# Patient Record
Sex: Male | Born: 1955 | Race: White | Hispanic: No | Marital: Married | Smoking: Former smoker
Health system: Southern US, Community
[De-identification: ages and names within clinical notes are randomized; demographics above are authoritative.]

## PROBLEM LIST (undated history)

## (undated) DIAGNOSIS — G2581 Restless legs syndrome: Secondary | ICD-10-CM

## (undated) DIAGNOSIS — K274 Chronic or unspecified peptic ulcer, site unspecified, with hemorrhage: Secondary | ICD-10-CM

## (undated) DIAGNOSIS — M1990 Unspecified osteoarthritis, unspecified site: Secondary | ICD-10-CM

## (undated) DIAGNOSIS — K219 Gastro-esophageal reflux disease without esophagitis: Secondary | ICD-10-CM

## (undated) DIAGNOSIS — M545 Low back pain, unspecified: Secondary | ICD-10-CM

## (undated) DIAGNOSIS — I10 Essential (primary) hypertension: Secondary | ICD-10-CM

## (undated) DIAGNOSIS — M542 Cervicalgia: Secondary | ICD-10-CM

## (undated) DIAGNOSIS — E119 Type 2 diabetes mellitus without complications: Secondary | ICD-10-CM

## (undated) DIAGNOSIS — R269 Unspecified abnormalities of gait and mobility: Secondary | ICD-10-CM

## (undated) DIAGNOSIS — N400 Enlarged prostate without lower urinary tract symptoms: Secondary | ICD-10-CM

## (undated) DIAGNOSIS — G8929 Other chronic pain: Secondary | ICD-10-CM

## (undated) DIAGNOSIS — J449 Chronic obstructive pulmonary disease, unspecified: Secondary | ICD-10-CM

## (undated) DIAGNOSIS — G629 Polyneuropathy, unspecified: Secondary | ICD-10-CM

## (undated) DIAGNOSIS — J45909 Unspecified asthma, uncomplicated: Secondary | ICD-10-CM

## (undated) HISTORY — DX: Unspecified asthma, uncomplicated: J45.909

## (undated) HISTORY — DX: Gastro-esophageal reflux disease without esophagitis: K21.9

## (undated) HISTORY — DX: Unspecified osteoarthritis, unspecified site: M19.90

## (undated) HISTORY — DX: Type 2 diabetes mellitus without complications: E11.9

## (undated) HISTORY — DX: Low back pain: M54.5

## (undated) HISTORY — DX: Cervicalgia: M54.2

## (undated) HISTORY — DX: Chronic obstructive pulmonary disease, unspecified: J44.9

## (undated) HISTORY — DX: Benign prostatic hyperplasia without lower urinary tract symptoms: N40.0

## (undated) HISTORY — DX: Polyneuropathy, unspecified: G62.9

## (undated) HISTORY — DX: Restless legs syndrome: G25.81

## (undated) HISTORY — DX: Chronic or unspecified peptic ulcer, site unspecified, with hemorrhage: K27.4

## (undated) HISTORY — DX: Other chronic pain: G89.29

## (undated) HISTORY — DX: Low back pain, unspecified: M54.50

## (undated) HISTORY — DX: Essential (primary) hypertension: I10

## (undated) HISTORY — DX: Unspecified abnormalities of gait and mobility: R26.9

---

## 2007-04-25 HISTORY — PX: APPENDECTOMY: SHX54

## 2012-07-04 DIAGNOSIS — M5136 Other intervertebral disc degeneration, lumbar region: Secondary | ICD-10-CM

## 2012-07-04 DIAGNOSIS — M179 Osteoarthritis of knee, unspecified: Secondary | ICD-10-CM | POA: Insufficient documentation

## 2012-07-04 DIAGNOSIS — M51369 Other intervertebral disc degeneration, lumbar region without mention of lumbar back pain or lower extremity pain: Secondary | ICD-10-CM

## 2012-07-04 HISTORY — DX: Osteoarthritis of knee, unspecified: M17.9

## 2012-07-04 HISTORY — DX: Other intervertebral disc degeneration, lumbar region without mention of lumbar back pain or lower extremity pain: M51.369

## 2014-11-26 DIAGNOSIS — H9190 Unspecified hearing loss, unspecified ear: Secondary | ICD-10-CM

## 2014-11-26 DIAGNOSIS — R972 Elevated prostate specific antigen [PSA]: Secondary | ICD-10-CM

## 2014-11-26 DIAGNOSIS — G4730 Sleep apnea, unspecified: Secondary | ICD-10-CM

## 2014-11-26 DIAGNOSIS — C61 Malignant neoplasm of prostate: Secondary | ICD-10-CM

## 2014-11-26 DIAGNOSIS — J439 Emphysema, unspecified: Secondary | ICD-10-CM

## 2014-11-26 DIAGNOSIS — I10 Essential (primary) hypertension: Secondary | ICD-10-CM | POA: Insufficient documentation

## 2014-11-26 HISTORY — DX: Elevated prostate specific antigen (PSA): R97.20

## 2014-11-26 HISTORY — DX: Malignant neoplasm of prostate: C61

## 2014-11-26 HISTORY — DX: Essential (primary) hypertension: I10

## 2014-11-26 HISTORY — DX: Sleep apnea, unspecified: G47.30

## 2014-11-26 HISTORY — DX: Unspecified hearing loss, unspecified ear: H91.90

## 2014-11-26 HISTORY — DX: Emphysema, unspecified: J43.9

## 2014-12-30 DIAGNOSIS — K219 Gastro-esophageal reflux disease without esophagitis: Secondary | ICD-10-CM | POA: Insufficient documentation

## 2014-12-30 DIAGNOSIS — J45909 Unspecified asthma, uncomplicated: Secondary | ICD-10-CM | POA: Insufficient documentation

## 2014-12-30 DIAGNOSIS — J309 Allergic rhinitis, unspecified: Secondary | ICD-10-CM

## 2014-12-30 HISTORY — DX: Gastro-esophageal reflux disease without esophagitis: K21.9

## 2014-12-30 HISTORY — DX: Allergic rhinitis, unspecified: J30.9

## 2015-02-08 ENCOUNTER — Ambulatory Visit: Payer: Medicare Other | Admitting: Allergy and Immunology

## 2015-02-11 ENCOUNTER — Ambulatory Visit: Payer: Medicare Other | Admitting: Allergy and Immunology

## 2015-03-08 ENCOUNTER — Encounter: Payer: Self-pay | Admitting: Allergy and Immunology

## 2015-03-08 ENCOUNTER — Other Ambulatory Visit: Payer: Self-pay

## 2015-03-08 ENCOUNTER — Ambulatory Visit (INDEPENDENT_AMBULATORY_CARE_PROVIDER_SITE_OTHER): Payer: Medicare Other | Admitting: Allergy and Immunology

## 2015-03-08 VITALS — BP 130/76 | HR 92 | Resp 18 | Ht 70.0 in | Wt 325.0 lb

## 2015-03-08 DIAGNOSIS — K219 Gastro-esophageal reflux disease without esophagitis: Secondary | ICD-10-CM

## 2015-03-08 DIAGNOSIS — J4540 Moderate persistent asthma, uncomplicated: Secondary | ICD-10-CM | POA: Diagnosis not present

## 2015-03-08 DIAGNOSIS — J3089 Other allergic rhinitis: Secondary | ICD-10-CM | POA: Diagnosis not present

## 2015-03-08 DIAGNOSIS — F329 Major depressive disorder, single episode, unspecified: Secondary | ICD-10-CM

## 2015-03-08 DIAGNOSIS — J387 Other diseases of larynx: Secondary | ICD-10-CM

## 2015-03-08 DIAGNOSIS — F32A Depression, unspecified: Secondary | ICD-10-CM

## 2015-03-08 MED ORDER — DEXLANSOPRAZOLE 60 MG PO CPDR: 60.0000 mg | DELAYED_RELEASE_CAPSULE | Freq: Every day | ORAL | Status: DC

## 2015-03-08 MED ORDER — DEXLANSOPRAZOLE 60 MG PO CPDR
60.0000 mg | DELAYED_RELEASE_CAPSULE | Freq: Two times a day (BID) | ORAL | Status: DC
Start: 2015-03-08 — End: 2022-04-06

## 2015-03-08 NOTE — Progress Notes (Signed)
[facility]   Follow-up Note  Refering Provider: No ref. provider found Primary Provider: Noni SaupeEDDING II,JOHN F., MD  Subjective:   Marcus PeatDarrell Theisen Jr. is a 59 y.o. male who [facility] in re-evaluation of the following:  HPI Comments:  Marcus Harding returns to this clinic noting that he has done relatively well regarding his lungs and nose while consistently using medical therapy which at this point in time includes a combination of Symbicort and nasal fluticasone. He has not had any exacerbations of his asthma nor need to use a systemic steroids since I last seen him in this clinic approximately 3 months ago. He does not use a short acting bronchodilator. His reflux is been under very good control while using Dexilant twice a day. When he uses it once a day he develops problems with reflux. He has had an active issue with his depression. He recently saw Dr. Jeanie Seweredding who put him on Wellbutrin in addition to his Zoloft which has not resulted in resolution of this issue. He's been on this medication for 4 weeks.   Outpatient Encounter Prescriptions as of 03/08/2015  Medication Sig  . albuterol (PROVENTIL HFA) 108 (90 BASE) MCG/ACT inhaler Inhale into the lungs.  . budesonide-formoterol (SYMBICORT) 160-4.5 MCG/ACT inhaler Inhale 2 puffs into the lungs 2 (two) times daily.  . BuPROPion HCl (WELLBUTRIN PO) Take 1 tablet by mouth daily.  . celecoxib (CELEBREX) 100 MG capsule Take 200 mg by mouth.  . fluticasone (VERAMYST) 27.5 MCG/SPRAY nasal spray Place 1 spray into the nose daily.  Marland Kitchen. gabapentin (NEURONTIN) 300 MG capsule Take 300 mg by mouth.  Marland Kitchen. lisinopril (PRINIVIL,ZESTRIL) 40 MG tablet Take 40 mg by mouth.  Marland Kitchen. rOPINIRole (REQUIP) 0.5 MG tablet Take 0.5 mg by mouth at bedtime.  Marland Kitchen. rOPINIRole (REQUIP) 1 MG tablet Take 1 mg by mouth.  . sertraline (ZOLOFT) 100 MG tablet Take 100 mg by mouth.  . sodium chloride (OCEAN)  0.65 % nasal spray Place 1 spray into the nose.  . tamsulosin (FLOMAX) 0.4 MG CAPS capsule Take 0.4 mg by mouth at bedtime.  . [DISCONTINUED] albuterol (PROVENTIL HFA) 108 (90 BASE) MCG/ACT inhaler Inhale 2 puffs into the lungs every 6 (six) hours as needed for wheezing or shortness of breath.  . [DISCONTINUED] dexlansoprazole (DEXILANT) 60 MG capsule Take 1 capsule (60 mg total) by mouth daily.  . [DISCONTINUED] lisinopril (PRINIVIL,ZESTRIL) 40 MG tablet Take 40 mg by mouth daily.  . [DISCONTINUED] sertraline (ZOLOFT) 100 MG tablet Take 100 mg by mouth daily.   No facility-administered encounter medications on file as of 03/08/2015.    Meds ordered this encounter  Medications  . DISCONTD: dexlansoprazole (DEXILANT) 60 MG capsule    Sig: Take 1 capsule (60 mg total) by mouth daily.    Dispense:  60 capsule    Refill:  5    Past Medical History  Diagnosis Date  . Asthma     Past Surgical History  Procedure Laterality Date  . Appendectomy  2009    Allergies  Allergen Reactions  . Corticosteroids Rash  . Cortisol [Hydrocortisone]   . Cortisone     Other reaction(s): ITCHING  . Other     Other reaction(s): ITCHING    Review of Systems  Constitutional: Negative for fever and chills.  HENT: Negative for congestion, ear pain, facial swelling, nosebleeds, postnasal drip, rhinorrhea, sinus pressure, sneezing, sore throat, tinnitus, trouble swallowing and voice change.  Eyes: Negative for pain, discharge, redness and itching.  Respiratory: Negative for cough, choking, chest tightness, shortness of breath, wheezing and stridor.   Cardiovascular: Negative for chest pain and leg swelling.  Gastrointestinal: Negative for nausea, vomiting and abdominal pain.  Endocrine: Negative for cold intolerance and heat intolerance.  Genitourinary: Negative for difficulty urinating.  Allergic/Immunologic: Negative.   Neurological: Negative for dizziness.  Hematological: Negative for  adenopathy.  Psychiatric/Behavioral: Positive for dysphoric mood.     Objective:   Filed Vitals:   03/08/15 1117  BP: 130/76  Pulse: 92  Resp: 18   Height:  (177.8 cm)  Weight: (!) 325 lb (147.419 kg)   Physical Exam  Constitutional: He appears well-developed and well-nourished. No distress.  HENT:  Head: Normocephalic and atraumatic. Head is without right periorbital erythema and without left periorbital erythema.  Right Ear: Tympanic membrane, external ear and ear canal normal. No drainage or tenderness. No foreign bodies. Tympanic membrane is not injected, not scarred, not perforated, not erythematous, not retracted and not bulging. No middle ear effusion.  Left Ear: Tympanic membrane, external ear and ear canal normal. No drainage or tenderness. No foreign bodies. Tympanic membrane is not injected, not scarred, not perforated, not erythematous, not retracted and not bulging.  No middle ear effusion.  Nose: Nose normal. No mucosal edema, rhinorrhea, nose lacerations or sinus tenderness.  No foreign bodies.  Mouth/Throat: Oropharynx is clear and moist. No oropharyngeal exudate, posterior oropharyngeal edema, posterior oropharyngeal erythema or tonsillar abscesses.  Eyes: Lids are normal. Right eye exhibits no chemosis, no discharge and no exudate. No foreign body present in the right eye. Left eye exhibits no chemosis, no discharge and no exudate. No foreign body present in the left eye. Right conjunctiva is not injected. Left conjunctiva is not injected.  Neck: Neck supple. No tracheal tenderness present. No tracheal deviation and no edema present. No thyroid mass and no thyromegaly present.  Cardiovascular: Normal rate, regular rhythm, S1 normal and S2 normal.  Exam reveals no gallop.   No murmur heard. Pulmonary/Chest: No accessory muscle usage or stridor. No respiratory distress. He has no wheezes. He has no rhonchi. He has no rales.  Abdominal: Soft.  Lymphadenopathy:        Head (right side): No tonsillar adenopathy present.       Head (left side): No tonsillar adenopathy present.    He has no cervical adenopathy.  Neurological: He is alert.  Skin: No rash noted. He is not diaphoretic. No erythema.  Psychiatric: He has a normal mood and affect. His behavior is normal.    Diagnostics:    Spirometry was performed and demonstrated an FEV1 of 3.05 at 84 % of predicted.  The patient had an Asthma Control Test with the following results: ACT Total Score: 16.    Assessment and Plan:   1. Asthma, moderate persistent, uncomplicated   2. LPRD (laryngopharyngeal reflux disease)   3. Other allergic rhinitis   4. Depression      1. Dexilant 60 one tablet TWO times a day. If insurance will not pay for two times per day then can try  of ranitidine in evening while continuing the dexilant in the morning.  2. Continue symbicort 160 two inhalations two times per day   3. Continue nasal fluticasone 1-2 sprays each nostril 3-7 times per week.  4. Continue Provental HFA if needed.  5. Revisit with Dr. Jeanie Sewer about depression treatment.  6. Return in 6 months or earlier if problem  Overall Marcus is done relatively well and I do not really want to change his medications at this point in time. I will see him back in this clinic in 6 months or earlier if there is a problem. We may need to manipulate his medications based upon his insurance coverage. I've asked him to discuss with Dr. Jeanie Sewer about further treatment concerning his depression.   Laurette Schimke, MD  [facility]

## 2015-03-08 NOTE — Patient Instructions (Signed)
  1. Dexilant 60 one tablet TWO times a day. If insurance will not pay for two times per day then can try 300mg  of ranitidine in evening while continuing the dexilant in the morning.  2. Continue symbicort 160 two inhalations two times per day   3. Continue nasal fluticasone 1-2 sprays each nostril 3-7 times per week.  4. Continue Provental HFA if needed.  5. Revisit with Dr. Jeanie Seweredding about depression treatment.  6. Return in 6 months or earlier if problem

## 2015-09-06 ENCOUNTER — Ambulatory Visit: Payer: Medicare Other | Admitting: Allergy and Immunology

## 2015-09-22 ENCOUNTER — Ambulatory Visit (INDEPENDENT_AMBULATORY_CARE_PROVIDER_SITE_OTHER): Payer: Medicare Other | Admitting: Allergy and Immunology

## 2015-09-22 ENCOUNTER — Encounter: Payer: Self-pay | Admitting: Allergy and Immunology

## 2015-09-22 VITALS — BP 150/80 | HR 76 | Resp 24

## 2015-09-22 DIAGNOSIS — J4540 Moderate persistent asthma, uncomplicated: Secondary | ICD-10-CM | POA: Diagnosis not present

## 2015-09-22 DIAGNOSIS — G4733 Obstructive sleep apnea (adult) (pediatric): Secondary | ICD-10-CM | POA: Diagnosis not present

## 2015-09-22 DIAGNOSIS — Z9989 Dependence on other enabling machines and devices: Secondary | ICD-10-CM

## 2015-09-22 DIAGNOSIS — J387 Other diseases of larynx: Secondary | ICD-10-CM

## 2015-09-22 DIAGNOSIS — J3089 Other allergic rhinitis: Secondary | ICD-10-CM | POA: Diagnosis not present

## 2015-09-22 DIAGNOSIS — K219 Gastro-esophageal reflux disease without esophagitis: Secondary | ICD-10-CM

## 2015-09-22 NOTE — Patient Instructions (Addendum)
  1. Continue Dexilant 60 one tablet one times a day.    2. Continue symbicort 160 two inhalations two times per day   3. Continue nasal fluticasone 1-2 sprays each nostril 3-7 times per week.  4. Continue Provental HFA if needed.  5. Return in 6 months or earlier if problem  6. Get fall flu vaccine

## 2015-09-22 NOTE — Progress Notes (Signed)
Follow-up Note  Referring Provider: Noni Saupe, MD Primary Provider: Noni Saupe., MD Date of Office Visit: 09/22/2015  Subjective:   Marcus Harding. (DOB: 1955/05/05) is a 60 y.o. male who [facility] in re-evaluation of the following:  HPI: Marcus Harding presents to this clinic in reevaluation of his asthma and allergic rhinoconjunctivitis and reflux-induced respiratory disease. I've not seen him in his clinic since November 2016.  During the interval his asthma has been under very good control without the need for systemic steroid and his bronchodilator requirement is approximately 1 time per week. He does not really exercise to any degree basically because of a musculoskeletal issue. He's been consistently using his inhaled steroid-laba combination.  His nose has not really been causing him any difficulty throughout the past 6 months. He does not use any nasal fluticasone at this point in time because his nose is doing so well.  Reflux has been under control. He did visit with Dr. Jennye Boroughs who performed a colonoscopy and an endoscopy. He continues on Dexilant one time per day and is not using any other medications for his reflux at this point.  He remains on CPAP for his sleep apnea yet he does appear to have some disturbed sleep. He usally wakes up once or twice a night for some unknown reason and sometimes he'll get up at around 3:00 in the morning and just start the day. His wife tells him that he probably needs to have a repeat sleep study but he is not interested in all in having any further evaluation for his sleep apnea and is satisfied with using his CPAP machine as presently operating.    Medication List           budesonide-formoterol 160-4.5 MCG/ACT inhaler  Commonly known as:  SYMBICORT  Inhale 2 puffs into the lungs 2 (two) times daily.     celecoxib 100 MG capsule  Commonly known as:  CELEBREX  Take 200  mg by mouth.     dexlansoprazole 60 MG capsule  Commonly known as:  DEXILANT  Take 1 capsule (60 mg total) by mouth 2 (two) times daily.     gabapentin 300 MG capsule  Commonly known as:  NEURONTIN  Take 300 mg by mouth.     lisinopril 40 MG tablet  Commonly known as:  PRINIVIL,ZESTRIL  Take 40 mg by mouth.     PROVENTIL HFA 108 (90 Base) MCG/ACT inhaler  Generic drug:  albuterol  Inhale into the lungs.     rOPINIRole 0.5 MG tablet  Commonly known as:  REQUIP  Take 0.5 mg by mouth at bedtime.     rOPINIRole 1 MG tablet  Commonly known as:  REQUIP  Take 1 mg by mouth.     sertraline 100 MG tablet  Commonly known as:  ZOLOFT  Take 100 mg by mouth.     sodium chloride 0.65 % nasal spray  Commonly known as:  OCEAN  Place 1 spray into the nose.     tamsulosin 0.4 MG Caps capsule  Commonly known as:  FLOMAX  Take 0.4 mg by mouth at bedtime.     WELLBUTRIN PO  Take 1 tablet by mouth daily.        Past Medical History  Diagnosis Date  . Asthma   . GERD (gastroesophageal reflux disease)     Past Surgical History  Procedure Laterality Date  . Appendectomy  2009  Allergies  Allergen Reactions  . Corticosteroids Rash  . Cortisol [Hydrocortisone]   . Cortisone     Other reaction(s): ITCHING  . Other     Other reaction(s): ITCHING    Review of systems negative except as noted in HPI / PMHx or noted below:  Review of Systems  Constitutional: Negative.   HENT: Negative.   Eyes: Negative.   Respiratory: Negative.   Cardiovascular: Negative.   Gastrointestinal: Negative.   Genitourinary: Negative.   Musculoskeletal: Negative.   Skin: Negative.   Neurological: Negative.   Endo/Heme/Allergies: Negative.   Psychiatric/Behavioral: Negative.      Objective:   Filed Vitals:   09/22/15 1646  BP: 150/80  Pulse: 76  Resp: 24          Physical Exam  Constitutional: He is well-developed, well-nourished, and in no distress.  HENT:  Head:  Normocephalic.  Right Ear: Tympanic membrane, external ear and ear canal normal.  Left Ear: Tympanic membrane, external ear and ear canal normal.  Nose: Nose normal. No mucosal edema or rhinorrhea.  Mouth/Throat: Uvula is midline, oropharynx is clear and moist and mucous membranes are normal. No oropharyngeal exudate.  Eyes: Conjunctivae are normal.  Neck: Trachea normal. No tracheal tenderness present. No tracheal deviation present. No thyromegaly present.  Cardiovascular: Normal rate, regular rhythm, S1 normal, S2 normal and normal heart sounds.   No murmur heard. Pulmonary/Chest: Breath sounds normal. No stridor. No respiratory distress. He has no wheezes. He has no rales.  Musculoskeletal: He exhibits no edema.  Lymphadenopathy:       Head (right side): No tonsillar adenopathy present.       Head (left side): No tonsillar adenopathy present.    He has no cervical adenopathy.  Neurological: He is alert. Gait normal.  Skin: No rash noted. He is not diaphoretic. No erythema. Nails show no clubbing.  Psychiatric: Mood and affect normal.    Diagnostics:    Spirometry was performed and demonstrated an FEV1 of 2.42 at 67 % of predicted.  The patient had an Asthma Control Test with the following results: ACT Total Score: 15.    Assessment and Plan:   1. Asthma, moderate persistent, uncomplicated   2. LPRD (laryngopharyngeal reflux disease)   3. Other allergic rhinitis   4. OSA on CPAP     1. Continue Dexilant 60 one tablet one times a day.    2. Continue symbicort 160 two inhalations two times per day   3. Continue nasal fluticasone 1-2 sprays each nostril 3-7 times per week.  4. Continue Provental HFA if needed.  5. Return in 6 months or earlier if problem  6. Get fall flu vaccine  Marcus Harding appears to be doing relatively well at this point and I see no need for changing his medical therapy and he will continue to use anti-inflammatory medications for his respiratory tract and  continue to use a proton pump inhibitor for his reflux. I will see him back in this clinic in 6 months or earlier if there is a problem.  Marcus SchimkeEric Kozlow, MD [facility]

## 2016-05-09 DIAGNOSIS — H18413 Arcus senilis, bilateral: Secondary | ICD-10-CM | POA: Diagnosis not present

## 2016-05-09 DIAGNOSIS — H25813 Combined forms of age-related cataract, bilateral: Secondary | ICD-10-CM | POA: Diagnosis not present

## 2016-05-09 DIAGNOSIS — H04123 Dry eye syndrome of bilateral lacrimal glands: Secondary | ICD-10-CM | POA: Diagnosis not present

## 2016-05-23 ENCOUNTER — Ambulatory Visit (INDEPENDENT_AMBULATORY_CARE_PROVIDER_SITE_OTHER): Payer: Medicare PPO | Admitting: Allergy

## 2016-05-23 ENCOUNTER — Encounter: Payer: Self-pay | Admitting: Allergy

## 2016-05-23 VITALS — BP 120/74 | HR 68 | Temp 98.3°F | Resp 16

## 2016-05-23 DIAGNOSIS — J0100 Acute maxillary sinusitis, unspecified: Secondary | ICD-10-CM

## 2016-05-23 DIAGNOSIS — J111 Influenza due to unidentified influenza virus with other respiratory manifestations: Secondary | ICD-10-CM

## 2016-05-23 DIAGNOSIS — J4541 Moderate persistent asthma with (acute) exacerbation: Secondary | ICD-10-CM | POA: Diagnosis not present

## 2016-05-23 MED ORDER — AZITHROMYCIN 250 MG PO TABS: ORAL_TABLET | ORAL | 0 refills | Status: DC

## 2016-05-23 MED ORDER — OSELTAMIVIR PHOSPHATE 75 MG PO CAPS: ORAL_CAPSULE | ORAL | 0 refills | Status: DC

## 2016-05-23 NOTE — Patient Instructions (Addendum)
Respiratory tract infection with asthma exacerbation   - your symptoms appear consistent with influenza   - will prescribe Tamiflu for treatment of influenza as well as Azithromycin   - take prednisone as prescribed (20mg  twice day x 3 days, then 20mg  x 1 day, the 10mg  x1 day then stop   - continue your routine asthma medications (symbicort and as needed albuterol).    - continue nasal saline rinses followed by your nasal spray    - tylenol and/or ibuprofen (alternating every 3 hours if you use both) for fever control and body aches    - maintain adequate hydration  Follow-up in 3 months or sooner if needed

## 2016-05-23 NOTE — Progress Notes (Signed)
Follow-up Note  RE: Marcus Harding. MRN: [id number] DOB: 14-Nov-1955 Date of Office Visit: 05/23/2016   History of present illness: Marcus Harding. is a 61 y.o. male presenting today for sick visit. He presents today with his wife who has similar symptoms. He was last seen in the office for routine follow-up in May 2017 by Dr. Lucie Leather.   He reports coughing, wheezing, chest tightness. He has had green nasal drainage and phlegm.   He does not feel he has had any fever.  Headaches for past week.  He reports his upper teeth hurt and into his cheeks.  He reports joint pains/aches at baseline however they are a bit worse currently.  He was not sure who was sick first time or his wife.  He otherwise denies any other sick contacts however they do visit "rest homes" but they were not told that there were any illnesses.  He continues to use his Symbicort 2 puffs twice a day.  He has albuterol that he reports uses infrequently and has not used with current symptoms. He also has Flonase that he has not used. His wife does report they both use nasal saline rinses.     Review of systems: Review of Systems  Constitutional: Positive for malaise/fatigue. Negative for chills, diaphoresis and fever.  HENT: Positive for congestion and sinus pain. Negative for ear pain, nosebleeds and sore throat.   Eyes: Negative for discharge and redness.  Respiratory: Positive for cough, shortness of breath and wheezing.   Cardiovascular: Positive for chest pain (Tightness).  Gastrointestinal: Positive for heartburn. Negative for abdominal pain, diarrhea, nausea and vomiting.  Musculoskeletal: Positive for joint pain.  Skin: Negative for itching and rash.    All other systems negative unless noted above in HPI  Past medical/social/surgical/family history have been reviewed and are unchanged unless specifically indicated below.  No changes  Medication List: Allergies as of 05/23/2016      Reactions   Corticosteroids Rash   Cortisol [hydrocortisone]    Cortisone    Other reaction(s): ITCHING   Other    Other reaction(s): ITCHING      Medication List       Accurate as of 05/23/16  5:32 PM. Always use your most recent med list.          azithromycin 250 MG tablet Commonly known as:  ZITHROMAX Take two tablets day one then one tablet daily for 4 days   B-12 1000 MCG Caps Take 1 capsule by mouth 2 (two) times daily.   budesonide-formoterol 160-4.5 MCG/ACT inhaler Commonly known as:  SYMBICORT Inhale 2 puffs into the lungs 2 (two) times daily.   celecoxib 200 MG capsule Commonly known as:  CELEBREX Take 200 mg by mouth daily.   dexlansoprazole 60 MG capsule Commonly known as:  DEXILANT Take 1 capsule (60 mg total) by mouth 2 (two) times daily.   gabapentin 400 MG capsule Commonly known as:  NEURONTIN Take 1 capsule by mouth 3 (three) times daily.   lisinopril 40 MG tablet Commonly known as:  PRINIVIL,ZESTRIL Take 20 mg by mouth.   oseltamivir 75 MG capsule Commonly known as:  TAMIFLU Take one capsule twice daily for 5 days   pravastatin 20 MG tablet Commonly known as:  PRAVACHOL daily.   PROVENTIL HFA 108 (90 Base) MCG/ACT inhaler Generic drug:  albuterol Inhale into the lungs.   rOPINIRole 0.5 MG tablet Commonly known as:  REQUIP Take 0.5 mg by mouth at bedtime.   rOPINIRole  1 MG tablet Commonly known as:  REQUIP Take 1 mg by mouth.   sertraline 100 MG tablet Commonly known as:  ZOLOFT Take 100 mg by mouth 2 (two) times daily.   tamsulosin 0.4 MG Caps capsule Commonly known as:  FLOMAX Take 0.4 mg by mouth at bedtime.       Known medication allergies: Allergies  Allergen Reactions  . Corticosteroids Rash  . Cortisol [Hydrocortisone]   . Cortisone     Other reaction(s): ITCHING  . Other     Other reaction(s): ITCHING     Physical examination: Blood pressure 120/74, pulse 68, temperature 98.3 F (36.8 C), temperature source Oral,  resp. rate 16.  General: Alert, interactive, in no acute distress. HEENT: TMs pearly gray, turbinates moderately edematous with thick discharge, post-pharynx non erythematous. Tenderness to palpation over maxillary sinuses bilaterally Neck: Supple without lymphadenopathy. Lungs: Mildly decreased breath sounds bilaterally without wheezing, rhonchi or rales. {no increased work of breathing. CV: Normal S1, S2 without murmurs. Abdomen: Nondistended, nontender. Skin: Warm and dry, without lesions or rashes. Extremities:  No clubbing, cyanosis or edema. Neuro:   Grossly intact.  Diagnositics/Labs:  Spirometry: FEV1: 2.17 L 60%, FVC: 2.95 L 62%, ratio consistent with Moderate restrictive pattern  Assessment and plan:   Respiratory tract infection with asthma exacerbation   - He has symptoms consistent with influenza and acute sinusitis.  He has household member that has had fever, sore throat, headache, nausea, myalgias that is consistent with influenza and thus concerned that he has has contracted to.   Given his respiratory status and overall health will elect to treat with Tamiflu and antibiotic.  Will also provide oral steroid for exacerbation.   - will prescribe Tamiflu 75 mg twice a day 5 days as well as Azithromycin 500 mg take one then 250 mg day 2 through 5   - take prednisone as prescribed (20mg  twice day x 3 days, then 20mg  x 1 day, the 10mg  x1 day then stop   - continue your routine asthma medications (symbicort 160  2 puffs twice a day and as needed albuterol).    - continue nasal saline rinses followed by your fluticasone 2 sprays each nostril daily    - tylenol and/or ibuprofen (alternating every 3 hours if you use both) for fever control and body aches    - maintain adequate hydration  Follow-up in 3 months or sooner if needed  I appreciate the opportunity to take part in Marcus Harding's care. Please do not hesitate to contact me with questions.  Sincerely,   Margo AyeShaylar Tayvion Lauder,  MD Allergy/Immunology [facility]

## 2016-08-21 DIAGNOSIS — Z6841 Body Mass Index (BMI) 40.0 and over, adult: Secondary | ICD-10-CM | POA: Diagnosis not present

## 2016-08-21 DIAGNOSIS — R42 Dizziness and giddiness: Secondary | ICD-10-CM | POA: Diagnosis not present

## 2016-09-07 ENCOUNTER — Ambulatory Visit (INDEPENDENT_AMBULATORY_CARE_PROVIDER_SITE_OTHER): Payer: Medicare PPO | Admitting: Allergy and Immunology

## 2016-09-07 ENCOUNTER — Encounter: Payer: Self-pay | Admitting: Allergy and Immunology

## 2016-09-07 VITALS — BP 158/86 | HR 84 | Resp 20 | Ht 69.0 in | Wt 342.0 lb

## 2016-09-07 DIAGNOSIS — J3089 Other allergic rhinitis: Secondary | ICD-10-CM | POA: Diagnosis not present

## 2016-09-07 DIAGNOSIS — Z9989 Dependence on other enabling machines and devices: Secondary | ICD-10-CM

## 2016-09-07 DIAGNOSIS — K219 Gastro-esophageal reflux disease without esophagitis: Secondary | ICD-10-CM

## 2016-09-07 DIAGNOSIS — J4540 Moderate persistent asthma, uncomplicated: Secondary | ICD-10-CM

## 2016-09-07 DIAGNOSIS — G4733 Obstructive sleep apnea (adult) (pediatric): Secondary | ICD-10-CM | POA: Diagnosis not present

## 2016-09-07 NOTE — Progress Notes (Signed)
Follow-up Note  Referring Provider: Noni Saupe, MD Primary Provider: Noni Saupe, MD Date of Office Visit: 09/07/2016  Subjective:   Marcus Pring. (DOB: 08-30-1955) is a 61 y.o. male [facility] in re-evaluation of the following:  HPI: Marcus Harding returns to this clinic in reevaluation of his asthma, allergic rhinoconjunctivitis, reflux, sleep apnea, and morbid obesity. I have not seen him in his clinic since 2017. He did visit with Dr. Delorse Lek on 05/23/2016 for what appeared to be a respiratory tract infection with asthma flare requiring Tamiflu and prednisone.  Since that point he is done very well. He has had very little problems with his asthma and rarely uses a short acting bronchodilator. He does not really exercise because of musculoskeletal issues. He has not required a systemic steroids since I have last seen him in this clinic other than his flareup apparently associated with influenza in January.  His nose gets very congested when using his CPAP machine. He gets so congested that he has to remove his mask. During the daytime he does relatively well.  His reflux is okay. His insurance company made him decreases proton pump inhibitor one time per day and although he thinks that he did better on twice a day he believes that his control is acceptable at this point in time.  Allergies as of 09/07/2016      Reactions   Corticosteroids Rash   Cortisol [hydrocortisone]    Cortisone    Other reaction(s): ITCHING   Other    Other reaction(s): ITCHING      Medication List      B-12 1000 MCG Caps Take 1 capsule by mouth 2 (two) times daily.   budesonide-formoterol 160-4.5 MCG/ACT inhaler Commonly known as:  SYMBICORT Inhale 2 puffs into the lungs 2 (two) times daily.   celecoxib 200 MG capsule Commonly known as:  CELEBREX Take 200 mg by mouth daily.   dexlansoprazole 60 MG capsule Commonly known as:   DEXILANT Take 1 capsule (60 mg total) by mouth 2 (two) times daily.   gabapentin 400 MG capsule Commonly known as:  NEURONTIN Take 1 capsule by mouth 3 (three) times daily.   lisinopril 40 MG tablet Commonly known as:  PRINIVIL,ZESTRIL Take 20 mg by mouth.   pravastatin 20 MG tablet Commonly known as:  PRAVACHOL daily.   PROVENTIL HFA 108 (90 Base) MCG/ACT inhaler Generic drug:  albuterol Inhale into the lungs.   rOPINIRole 0.5 MG tablet Commonly known as:  REQUIP Take 0.5 mg by mouth at bedtime.   rOPINIRole 1 MG tablet Commonly known as:  REQUIP Take 1 mg by mouth.   sertraline 100 MG tablet Commonly known as:  ZOLOFT Take 100 mg by mouth 2 (two) times daily.   tamsulosin 0.4 MG Caps capsule Commonly known as:  FLOMAX Take 0.4 mg by mouth at bedtime.       Past Medical History:  Diagnosis Date  . Asthma   . GERD (gastroesophageal reflux disease)     Past Surgical History:  Procedure Laterality Date  . APPENDECTOMY  2009    Review of systems negative except as noted in HPI / PMHx or noted below:  Review of Systems  Constitutional: Negative.   HENT: Negative.   Eyes: Negative.   Respiratory: Negative.   Cardiovascular: Negative.   Gastrointestinal: Negative.   Genitourinary: Negative.   Musculoskeletal: Negative.   Skin: Negative.   Neurological: Negative.  Endo/Heme/Allergies: Negative.   Psychiatric/Behavioral: Negative.      Objective:   Vitals:   09/07/16 1113  BP: (!) 158/86  Pulse: 84  Resp: 20   Height: 5\' 9"  (175.3 cm)  Weight: (!) 342 lb (155.1 kg)   Physical Exam  Constitutional: He is well-developed, well-nourished, and in no distress.  HENT:  Head: Normocephalic.  Right Ear: Tympanic membrane, external ear and ear canal normal.  Left Ear: Tympanic membrane, external ear and ear canal normal.  Nose: Nose normal. No mucosal edema or rhinorrhea.  Mouth/Throat: Uvula is midline, oropharynx is clear and moist and mucous  membranes are normal. No oropharyngeal exudate.  Eyes: Conjunctivae are normal.  Neck: Trachea normal. No tracheal tenderness present. No tracheal deviation present. No thyromegaly present.  Cardiovascular: Normal rate, regular rhythm, S1 normal, S2 normal and normal heart sounds.   No murmur heard. Pulmonary/Chest: Breath sounds normal. No stridor. No respiratory distress. He has no wheezes. He has no rales.  Musculoskeletal: He exhibits no edema.  Lymphadenopathy:       Head (right side): No tonsillar adenopathy present.       Head (left side): No tonsillar adenopathy present.    He has no cervical adenopathy.  Neurological: He is alert. Gait normal.  Skin: No rash noted. He is not diaphoretic. No erythema. Nails show no clubbing.  Psychiatric: Mood and affect normal.    Diagnostics:    Spirometry was performed and demonstrated an FEV1 of 2.61 at 76 % of predicted.  Assessment and Plan:   1. Asthma, moderate persistent, well-controlled   2. Other allergic rhinitis   3. OSA on CPAP   4. LPRD (laryngopharyngeal reflux disease)     1. Continue Dexilant 60 one tablet one time a day.    2. Continue symbicort 160 two inhalations two times per day   3. Use a combination of the following every night before placing CPAP mask:   A. OTC Nasacort - one spray each nostril  B. OTC Afrin - one spray single nostril. Alternate nightly  4. Continue Provental HFA if needed.  5. Return in 6 months or earlier if problem  6. Get fall flu vaccine  [name] appears to be doing okay with his asthma and his reflux at this point but he is having problems with his sleep apnea and keeping his CPAP on his face at nighttime because of significant nasal congestion that develops at night. I will have him use a combination of a nasal steroid and a over-the-counter topical nasal decongestant using his nasal decongestant alternating nostrils so that only one nostril will experience exposure to nasal  decongestant every other night. Hopefully with concurrent administration of a nasal steroid with his topical nasal decongestant he will not develop a rebound effect. He will keep in contact with me noting his response and if he does well I will see him back in this clinic in 6 months or earlier if there is a problem.  Laurette SchimkeEric Kozlow, MD Allergy / Immunology [facility]

## 2016-09-07 NOTE — Patient Instructions (Signed)
  1. Continue Dexilant 60 one tablet one time a day.    2. Continue symbicort 160 two inhalations two times per day   3. Use a combination of the following every night before placing CPAP mask:   A. OTC Nasacort - one spray each nostril  B. OTC Afrin - one spray single nostril. Alternate nightly  4. Continue Provental HFA if needed.  5. Return in 6 months or earlier if problem  6. Get fall flu vaccine

## 2016-11-28 DIAGNOSIS — G4733 Obstructive sleep apnea (adult) (pediatric): Secondary | ICD-10-CM | POA: Diagnosis not present

## 2017-01-19 DIAGNOSIS — Q6689 Other  specified congenital deformities of feet: Secondary | ICD-10-CM | POA: Diagnosis not present

## 2017-01-19 DIAGNOSIS — G600 Hereditary motor and sensory neuropathy: Secondary | ICD-10-CM | POA: Diagnosis not present

## 2017-01-19 DIAGNOSIS — M65879 Other synovitis and tenosynovitis, unspecified ankle and foot: Secondary | ICD-10-CM | POA: Diagnosis not present

## 2017-01-19 DIAGNOSIS — M722 Plantar fascial fibromatosis: Secondary | ICD-10-CM | POA: Diagnosis not present

## 2017-02-02 DIAGNOSIS — M25571 Pain in right ankle and joints of right foot: Secondary | ICD-10-CM | POA: Diagnosis not present

## 2017-02-02 DIAGNOSIS — M79671 Pain in right foot: Secondary | ICD-10-CM | POA: Diagnosis not present

## 2017-02-08 DIAGNOSIS — Z8546 Personal history of malignant neoplasm of prostate: Secondary | ICD-10-CM | POA: Diagnosis not present

## 2017-02-08 DIAGNOSIS — J449 Chronic obstructive pulmonary disease, unspecified: Secondary | ICD-10-CM | POA: Diagnosis not present

## 2017-02-08 DIAGNOSIS — S82831A Other fracture of upper and lower end of right fibula, initial encounter for closed fracture: Secondary | ICD-10-CM | POA: Diagnosis not present

## 2017-02-08 DIAGNOSIS — S99921A Unspecified injury of right foot, initial encounter: Secondary | ICD-10-CM | POA: Diagnosis not present

## 2017-02-08 DIAGNOSIS — M25571 Pain in right ankle and joints of right foot: Secondary | ICD-10-CM | POA: Diagnosis not present

## 2017-02-08 DIAGNOSIS — S99922A Unspecified injury of left foot, initial encounter: Secondary | ICD-10-CM | POA: Diagnosis not present

## 2017-02-08 DIAGNOSIS — S99911A Unspecified injury of right ankle, initial encounter: Secondary | ICD-10-CM | POA: Diagnosis not present

## 2017-02-08 DIAGNOSIS — I10 Essential (primary) hypertension: Secondary | ICD-10-CM | POA: Diagnosis not present

## 2017-02-08 DIAGNOSIS — Z79899 Other long term (current) drug therapy: Secondary | ICD-10-CM | POA: Diagnosis not present

## 2017-02-08 DIAGNOSIS — E1140 Type 2 diabetes mellitus with diabetic neuropathy, unspecified: Secondary | ICD-10-CM | POA: Diagnosis not present

## 2017-02-08 DIAGNOSIS — S8264XA Nondisplaced fracture of lateral malleolus of right fibula, initial encounter for closed fracture: Secondary | ICD-10-CM | POA: Diagnosis not present

## 2017-02-08 DIAGNOSIS — Z87891 Personal history of nicotine dependence: Secondary | ICD-10-CM | POA: Diagnosis not present

## 2017-02-08 DIAGNOSIS — M19071 Primary osteoarthritis, right ankle and foot: Secondary | ICD-10-CM | POA: Diagnosis not present

## 2017-02-12 DIAGNOSIS — M7989 Other specified soft tissue disorders: Secondary | ICD-10-CM | POA: Diagnosis not present

## 2017-02-12 DIAGNOSIS — M25571 Pain in right ankle and joints of right foot: Secondary | ICD-10-CM | POA: Diagnosis not present

## 2017-02-12 DIAGNOSIS — S82892A Other fracture of left lower leg, initial encounter for closed fracture: Secondary | ICD-10-CM | POA: Diagnosis not present

## 2017-03-12 ENCOUNTER — Ambulatory Visit: Payer: Medicare PPO | Admitting: Allergy and Immunology

## 2017-03-12 DIAGNOSIS — E785 Hyperlipidemia, unspecified: Secondary | ICD-10-CM | POA: Diagnosis not present

## 2017-03-12 DIAGNOSIS — Z0000 Encounter for general adult medical examination without abnormal findings: Secondary | ICD-10-CM | POA: Diagnosis not present

## 2017-03-12 DIAGNOSIS — Z79899 Other long term (current) drug therapy: Secondary | ICD-10-CM | POA: Diagnosis not present

## 2017-03-12 DIAGNOSIS — Z23 Encounter for immunization: Secondary | ICD-10-CM | POA: Diagnosis not present

## 2017-03-12 DIAGNOSIS — Z1331 Encounter for screening for depression: Secondary | ICD-10-CM | POA: Diagnosis not present

## 2017-03-12 DIAGNOSIS — D519 Vitamin B12 deficiency anemia, unspecified: Secondary | ICD-10-CM | POA: Diagnosis not present

## 2017-03-12 DIAGNOSIS — Z6841 Body Mass Index (BMI) 40.0 and over, adult: Secondary | ICD-10-CM | POA: Diagnosis not present

## 2017-03-12 DIAGNOSIS — Z131 Encounter for screening for diabetes mellitus: Secondary | ICD-10-CM | POA: Diagnosis not present

## 2017-03-12 DIAGNOSIS — Z125 Encounter for screening for malignant neoplasm of prostate: Secondary | ICD-10-CM | POA: Diagnosis not present

## 2017-03-19 ENCOUNTER — Ambulatory Visit: Payer: Medicare PPO | Admitting: Allergy and Immunology

## 2017-03-19 VITALS — BP 142/70 | HR 60 | Resp 20

## 2017-03-19 DIAGNOSIS — J3089 Other allergic rhinitis: Secondary | ICD-10-CM | POA: Diagnosis not present

## 2017-03-19 DIAGNOSIS — G4733 Obstructive sleep apnea (adult) (pediatric): Secondary | ICD-10-CM

## 2017-03-19 DIAGNOSIS — K219 Gastro-esophageal reflux disease without esophagitis: Secondary | ICD-10-CM | POA: Diagnosis not present

## 2017-03-19 DIAGNOSIS — Z9989 Dependence on other enabling machines and devices: Secondary | ICD-10-CM | POA: Diagnosis not present

## 2017-03-19 DIAGNOSIS — J4540 Moderate persistent asthma, uncomplicated: Secondary | ICD-10-CM | POA: Diagnosis not present

## 2017-03-19 NOTE — Patient Instructions (Signed)
  1. Continue Dexilant 60 one tablet one time a day in morning.  Can add OTC ranitidine 150 - 300 mg in evening  2. Continue Symbicort 160 two inhalations two times per day   3. Use a combination of the following every night before placing CPAP mask:   A. OTC Nasacort - one spray each nostril  B. OTC Afrin - one spray single nostril. Alternate nightly  4. Continue Provental HFA if needed.  5. Return in 6 months or earlier if problem  6. Change what and how you eat and on most meals stop eating before you are satisfied.

## 2017-03-19 NOTE — Progress Notes (Signed)
Follow-up Note  Referring Provider: Noni Saupeedding, John F. II, MD Primary Provider: Noni Saupeedding, John F. II, MD Date of Office Visit: 03/19/2017  Subjective:   Marcus PeatDarrell Cardenas Jr. (DOB: 03/12/1956) is a 61 y.o. male who [facility] in re-evaluation of the following:  HPI: Marcus Harding presents to this clinic in evaluation of asthma, allergic rhinoconjunctivitis, sleep apnea, morbid obesity, and reflux.  He was last seen in this clinic 07 Sep 2016.  For the most part his respiratory tract disease has been under very good control.  He has not required a systemic steroid or an antibiotic to treat any type of respiratory tract issue.  He rarely uses a short acting bronchodilator.  He does not exercise for a collection of reasons including his overall body status and recently fracture of his right ankle.  His reflux has been active.  He has always had marginal control of his reflux but he has noticed a little bit more issues with regurgitation especially if he eats more than he should.  As well, he has had a dull constant chest discomfort in his sternal region that has been there for a prolonged period in time and recently had an EKG performed by Dr. Jeanie Seweredding which apparently was normal.  He has not had good news concerning his hemoglobin A1c which apparently was 6.4 during his last evaluation.  His tolerance for CPAP machine is going quite well using a nasal pillow and also a combination of a nasal decongestant and nasal steroid prior to application of this device.  He did obtain a flu vaccine this year.  Allergies as of 03/19/2017      Reactions   Corticosteroids Rash   Cortisol [hydrocortisone]    Cortisone    Other reaction(s): ITCHING   Other    Other reaction(s): ITCHING      Medication List               B-12 1000 MCG Caps Take 1 capsule by mouth 2 (two) times daily.   budesonide-formoterol 160-4.5 MCG/ACT inhaler Commonly known as:   SYMBICORT Inhale 2 puffs into the lungs 2 (two) times daily.   celecoxib 200 MG capsule Commonly known as:  CELEBREX Take 200 mg by mouth daily.   dexlansoprazole 60 MG capsule Commonly known as:  DEXILANT Take 1 capsule (60 mg total) by mouth 2 (two) times daily.   gabapentin 400 MG capsule Commonly known as:  NEURONTIN Take 1 capsule by mouth 3 (three) times daily.   lisinopril 40 MG tablet Commonly known as:  PRINIVIL,ZESTRIL Take 20 mg by mouth.   pravastatin 20 MG tablet Commonly known as:  PRAVACHOL daily.   PROVENTIL HFA 108 (90 Base) MCG/ACT inhaler Generic drug:  albuterol Inhale into the lungs.   rOPINIRole 0.5 MG tablet Commonly known as:  REQUIP Take 0.5 mg by mouth at bedtime.   rOPINIRole 1 MG tablet Commonly known as:  REQUIP Take 1 mg by mouth.   sertraline 100 MG tablet Commonly known as:  ZOLOFT Take 100 mg by mouth 2 (two) times daily.   tamsulosin 0.4 MG Caps capsule Commonly known as:  FLOMAX Take 0.4 mg by mouth at bedtime.       Past Medical History:  Diagnosis Date  . Asthma   . GERD (gastroesophageal reflux disease)     Past Surgical History:  Procedure Laterality Date  . APPENDECTOMY  2009    Review of systems negative except as noted  in HPI / PMHx or noted below:  Review of Systems  Constitutional: Negative.   HENT: Negative.   Eyes: Negative.   Respiratory: Negative.   Cardiovascular: Negative.   Gastrointestinal: Negative.   Genitourinary: Negative.   Musculoskeletal: Negative.   Skin: Negative.   Neurological: Negative.   Endo/Heme/Allergies: Negative.   Psychiatric/Behavioral: Negative.      Objective:   Vitals:   03/19/17 1137  BP: (!) 142/70  Pulse: 60  Resp: 20          Physical Exam  Constitutional: He is well-developed, well-nourished, and in no distress.  HENT:  Head: Normocephalic.  Right Ear: Tympanic membrane, external ear and ear canal normal.  Left Ear: Tympanic membrane, external ear  and ear canal normal.  Nose: Nose normal. No mucosal edema or rhinorrhea.  Mouth/Throat: Uvula is midline, oropharynx is clear and moist and mucous membranes are normal. No oropharyngeal exudate.  Eyes: Conjunctivae are normal.  Neck: Trachea normal. No tracheal tenderness present. No tracheal deviation present. No thyromegaly present.  Cardiovascular: Normal rate, regular rhythm, S1 normal, S2 normal and normal heart sounds.  No murmur heard. Pulmonary/Chest: Breath sounds normal. No stridor. No respiratory distress. He has no wheezes. He has no rales.  Musculoskeletal: He exhibits no edema.  Lymphadenopathy:       Head (right side): No tonsillar adenopathy present.       Head (left side): No tonsillar adenopathy present.    He has no cervical adenopathy.  Neurological: He is alert. Gait normal.  Skin: No rash noted. He is not diaphoretic. No erythema. Nails show no clubbing.  Psychiatric: Mood and affect normal.    Diagnostics:    Spirometry was performed and demonstrated an FEV1 of 2.28 at 67 % of predicted.  The patient had an Asthma Control Test with the following results: ACT Total Score: 17.    Assessment and Plan:   1. Asthma, moderate persistent, well-controlled   2. Other allergic rhinitis   3. OSA on CPAP   4. LPRD (laryngopharyngeal reflux disease)     1. Continue Dexilant 60 one tablet one time a day in morning.  Can add OTC ranitidine 150 - 300 mg in evening  2. Continue Symbicort 160 two inhalations two times per day   3. Use a combination of the following every night before placing CPAP mask:   A. OTC Nasacort - one spray each nostril  B. OTC Afrin - one spray single nostril. Alternate nightly  4. Continue Provental HFA if needed.  5. Return in 6 months or earlier if problem  6. Change what and how you eat and on most meals stop eating before you are satisfied.   Overall Marcus Harding's respiratory tract disease appears to be under pretty good control but his  reflux is active and it is probably giving rise to some of the chest discomfort he describes and I made a suggestion about some of the things that he can do to address this issue including adding over-the-counter ranitidine to his proton pump inhibitor and losing weight and making good choices about what to eat and went to eat.  He will continue to utilize the plan mentioned above which includes a collection of anti-inflammatory agents for his respiratory tract and treatment against reflux and I will see him back in his clinic in 6 months or earlier if there is a problem.  Laurette SchimkeEric Daesean Lazarz, MD Allergy / Immunology [facility]

## 2017-03-20 ENCOUNTER — Encounter: Payer: Self-pay | Admitting: Allergy and Immunology

## 2017-03-20 DIAGNOSIS — Q6689 Other  specified congenital deformities of feet: Secondary | ICD-10-CM | POA: Diagnosis not present

## 2017-03-20 DIAGNOSIS — M25571 Pain in right ankle and joints of right foot: Secondary | ICD-10-CM | POA: Diagnosis not present

## 2017-05-08 DIAGNOSIS — M722 Plantar fascial fibromatosis: Secondary | ICD-10-CM | POA: Diagnosis not present

## 2017-05-08 DIAGNOSIS — M25571 Pain in right ankle and joints of right foot: Secondary | ICD-10-CM | POA: Diagnosis not present

## 2017-09-20 ENCOUNTER — Ambulatory Visit: Payer: Medicare PPO | Admitting: Allergy and Immunology

## 2017-09-20 ENCOUNTER — Encounter: Payer: Self-pay | Admitting: Allergy and Immunology

## 2017-09-20 VITALS — BP 138/80 | HR 84 | Resp 20

## 2017-09-20 DIAGNOSIS — G4733 Obstructive sleep apnea (adult) (pediatric): Secondary | ICD-10-CM | POA: Diagnosis not present

## 2017-09-20 DIAGNOSIS — K219 Gastro-esophageal reflux disease without esophagitis: Secondary | ICD-10-CM | POA: Diagnosis not present

## 2017-09-20 DIAGNOSIS — J3089 Other allergic rhinitis: Secondary | ICD-10-CM

## 2017-09-20 DIAGNOSIS — J4540 Moderate persistent asthma, uncomplicated: Secondary | ICD-10-CM

## 2017-09-20 DIAGNOSIS — Z9989 Dependence on other enabling machines and devices: Secondary | ICD-10-CM | POA: Diagnosis not present

## 2017-09-20 NOTE — Patient Instructions (Signed)
  1. Continue Dexilant 60 one tablet one time a day in morning.  Can add OTC ranitidine 150 - 300 mg in evening  2. Continue Symbicort 160 two inhalations two times per day   3. Use a combination of the following every night before placing CPAP mask:   A. OTC Nasacort - one spray each nostril  B. OTC Afrin - one spray single nostril. Alternate nightly  4. Continue Provental HFA if needed.  5. Return in 6 months or earlier if problem

## 2017-09-20 NOTE — Progress Notes (Signed)
Follow-up Note  Referring Provider: Noni Saupe, MD Primary Provider: Noni Saupe, MD Date of Office Visit: 09/20/2017  Subjective:   Marcus Harding. (DOB: August 01, 1955) is a 62 y.o. male who [facility] in re-evaluation of the following:  HPI: Marcus Harding returns to this clinic in reevaluation of asthma and allergic rhinoconjunctivitis and sleep apnea and reflux.  His last visit to this clinic was 19 March 2017.  Marcus Harding informs me that he has not required a systemic steroid or an antibiotic to treat any type of respiratory tract issue since being seen in this clinic last.  He rarely uses a short acting bronchodilator.  Because of his musculoskeletal issues he does not really exercise to any degree.  At this point his reflux is under excellent control.  He occasionally uses ranitidine added into his proton pump inhibitor.  He did undergo a prolonged episode of diarrhea this April at the same time that his wife was infected with Salmonella and E. coli.  Fortunately, this episode appears to have resolved.  His CPAP is going well.  He is not using a nasal decongestant at this point but does continue to use a nasal steroid.  Allergies as of 09/20/2017      Reactions   Cortisone Itching   Other reaction(s): ITCHING      Medication List      B-12 1000 MCG Caps Take 1 capsule by mouth 2 (two) times daily.   budesonide-formoterol 160-4.5 MCG/ACT inhaler Commonly known as:  SYMBICORT Inhale 2 puffs into the lungs 2 (two) times daily.   celecoxib 200 MG capsule Commonly known as:  CELEBREX Take 200 mg by mouth daily.   dexlansoprazole 60 MG capsule Commonly known as:  DEXILANT Take 1 capsule (60 mg total) by mouth 2 (two) times daily.   gabapentin 400 MG capsule Commonly known as:  NEURONTIN Take 1 capsule by mouth 3 (three) times daily.   lisinopril 40 MG tablet Commonly known as:  PRINIVIL,ZESTRIL Take 20 mg by  mouth.   pravastatin 20 MG tablet Commonly known as:  PRAVACHOL daily.   PROVENTIL HFA 108 (90 Base) MCG/ACT inhaler Generic drug:  albuterol Inhale into the lungs.   rOPINIRole 0.5 MG tablet Commonly known as:  REQUIP Take 0.5 mg by mouth at bedtime.   rOPINIRole 1 MG tablet Commonly known as:  REQUIP Take 1 mg by mouth.   sertraline 100 MG tablet Commonly known as:  ZOLOFT Take 100 mg by mouth 2 (two) times daily.   tamsulosin 0.4 MG Caps capsule Commonly known as:  FLOMAX Take 0.4 mg by mouth at bedtime.       Past Medical History:  Diagnosis Date  . Asthma   . GERD (gastroesophageal reflux disease)     Past Surgical History:  Procedure Laterality Date  . APPENDECTOMY  2009    Review of systems negative except as noted in HPI / PMHx or noted below:  Review of Systems  Constitutional: Negative.   HENT: Negative.   Eyes: Negative.   Respiratory: Negative.   Cardiovascular: Negative.   Gastrointestinal: Negative.   Genitourinary: Negative.   Musculoskeletal: Negative.   Skin: Negative.   Neurological: Negative.   Endo/Heme/Allergies: Negative.   Psychiatric/Behavioral: Negative.      Objective:   Vitals:   09/20/17 1113  BP: 138/80  Pulse: 84  Resp: 20          Physical Exam  HENT:  Head: Normocephalic.  Right Ear: Tympanic membrane, external ear and ear canal normal.  Left Ear: Tympanic membrane, external ear and ear canal normal.  Nose: Nose normal. No mucosal edema or rhinorrhea.  Mouth/Throat: Uvula is midline, oropharynx is clear and moist and mucous membranes are normal. No oropharyngeal exudate.  Eyes: Conjunctivae are normal.  Neck: Trachea normal. No tracheal tenderness present. No tracheal deviation present. No thyromegaly present.  Cardiovascular: Normal rate, regular rhythm, S1 normal, S2 normal and normal heart sounds.  No murmur heard. Pulmonary/Chest: Breath sounds normal. No stridor. No respiratory distress. He has no  wheezes. He has no rales.  Musculoskeletal: He exhibits no edema.  Lymphadenopathy:       Head (right side): No tonsillar adenopathy present.       Head (left side): No tonsillar adenopathy present.    He has no cervical adenopathy.  Neurological: He is alert.  Skin: No rash noted. He is not diaphoretic. No erythema. Nails show no clubbing.    Diagnostics:    Spirometry was performed and demonstrated an FEV1 of 2.15 at 63 % of predicted.  The patient had an Asthma Control Test with the following results: ACT Total Score: 17.    Assessment and Plan:   1. Asthma, moderate persistent, well-controlled   2. Other allergic rhinitis   3. OSA on CPAP   4. LPRD (laryngopharyngeal reflux disease)     1. Continue Dexilant 60 one tablet one time a day in morning.  Can add OTC ranitidine 150 - 300 mg in evening  2. Continue Symbicort 160 two inhalations two times per day   3. Use a combination of the following every night before placing CPAP mask:   A. OTC Nasacort - one spray each nostril  B. OTC Afrin - one spray single nostril. Alternate nightly  4. Continue Provental HFA if needed.  5. Return in 6 months or earlier if problem  Overall Marcus Harding appears to be doing relatively well on his current plan which includes anti-inflammatory agents for his respiratory tract and therapy directed against reflux and therapy directed against sleep apnea and I have refilled all of his medications and we will see him back in this clinic in 6 months or earlier if there is a problem.  Laurette Schimke, MD Allergy / Immunology [facility]

## 2017-09-24 ENCOUNTER — Encounter: Payer: Self-pay | Admitting: Allergy and Immunology

## 2017-10-29 DIAGNOSIS — Z1339 Encounter for screening examination for other mental health and behavioral disorders: Secondary | ICD-10-CM | POA: Diagnosis not present

## 2017-10-29 DIAGNOSIS — Z6841 Body Mass Index (BMI) 40.0 and over, adult: Secondary | ICD-10-CM | POA: Diagnosis not present

## 2017-10-29 DIAGNOSIS — R7302 Impaired glucose tolerance (oral): Secondary | ICD-10-CM | POA: Diagnosis not present

## 2017-11-15 DIAGNOSIS — D696 Thrombocytopenia, unspecified: Secondary | ICD-10-CM | POA: Diagnosis not present

## 2017-11-29 DIAGNOSIS — E1165 Type 2 diabetes mellitus with hyperglycemia: Secondary | ICD-10-CM | POA: Diagnosis not present

## 2017-11-29 DIAGNOSIS — R899 Unspecified abnormal finding in specimens from other organs, systems and tissues: Secondary | ICD-10-CM | POA: Diagnosis not present

## 2017-11-29 DIAGNOSIS — E7800 Pure hypercholesterolemia, unspecified: Secondary | ICD-10-CM | POA: Diagnosis not present

## 2017-11-29 DIAGNOSIS — Z6841 Body Mass Index (BMI) 40.0 and over, adult: Secondary | ICD-10-CM | POA: Diagnosis not present

## 2017-12-19 DIAGNOSIS — E119 Type 2 diabetes mellitus without complications: Secondary | ICD-10-CM | POA: Diagnosis not present

## 2017-12-28 DIAGNOSIS — D649 Anemia, unspecified: Secondary | ICD-10-CM | POA: Diagnosis not present

## 2018-03-14 ENCOUNTER — Encounter: Payer: Self-pay | Admitting: Allergy and Immunology

## 2018-03-14 ENCOUNTER — Ambulatory Visit: Payer: Medicare PPO | Admitting: Allergy and Immunology

## 2018-03-14 VITALS — BP 118/84 | HR 56 | Resp 16

## 2018-03-14 DIAGNOSIS — J4540 Moderate persistent asthma, uncomplicated: Secondary | ICD-10-CM

## 2018-03-14 DIAGNOSIS — Z9989 Dependence on other enabling machines and devices: Secondary | ICD-10-CM | POA: Diagnosis not present

## 2018-03-14 DIAGNOSIS — G4733 Obstructive sleep apnea (adult) (pediatric): Secondary | ICD-10-CM

## 2018-03-14 DIAGNOSIS — K219 Gastro-esophageal reflux disease without esophagitis: Secondary | ICD-10-CM | POA: Diagnosis not present

## 2018-03-14 DIAGNOSIS — J3089 Other allergic rhinitis: Secondary | ICD-10-CM | POA: Diagnosis not present

## 2018-03-14 NOTE — Patient Instructions (Addendum)
  1. Continue Dexilant 60 one tablet one time a day in morning.  Can add OTC Famotidine 40mg  mg in evening  2. Continue Symbicort 160 two inhalations two times per day   3. Can use OTC Nasacort 1-2 sprays each nostril 3-7 times per week  4. Continue Provental HFA if needed.  5. Return in 6 months or earlier if problem  6. Get a flu vaccine

## 2018-03-14 NOTE — Progress Notes (Signed)
Follow-up Note  Referring Provider: Noni Saupeedding, John F. II, MD Primary Provider: Noni Saupeedding, John F. II, MD Date of Office Visit: 03/14/2018  Subjective:   Marcus PeatDarrell Miu Jr. (DOB: Aug 21, 1955) is a 62 y.o. male who [facility] in re-evaluation of the following:  HPI: Marcus EmperorDarrell returns to this clinic in reevaluation of asthma and allergic rhinoconjunctivitis and sleep apnea and reflux.  His last visit to this clinic was 20 Sep 2017.  Overall he thinks that his asthma is doing relatively well and he does not use a short acting bronchodilator.  He does not really exercise to any degree for several issues including several muscular skeletal issues.  He has not required a systemic steroid or an antibiotic for respiratory tract issue since being seen in this clinic.  He still does have issues with reflux with regurgitation and burning on occasion.  He still has some intermittent nasal congestion but he no longer uses any topical nasal sprays.  He is using his CPAP machine.  He does complain about having some occasional chest tightness in his chest which is a been a chronic problem.  This is a nonexertional chest tightness.  He has seen a cardiologist in the distant past.  He will be visiting his primary care doctor at some point in the near future.  Allergies as of 03/14/2018      Reactions   Cortisone Itching   Other reaction(s): ITCHING      Medication List      B-12 1000 MCG Caps Take 1 capsule by mouth 2 (two) times daily.   budesonide-formoterol 160-4.5 MCG/ACT inhaler Commonly known as:  SYMBICORT Inhale 2 puffs into the lungs 2 (two) times daily.   celecoxib 200 MG capsule Commonly known as:  CELEBREX Take 200 mg by mouth daily.   dexlansoprazole 60 MG capsule Commonly known as:  DEXILANT Take 1 capsule (60 mg total) by mouth 2 (two) times daily.   gabapentin 400 MG capsule Commonly known as:  NEURONTIN Take 1 capsule by mouth 3  (three) times daily.   lisinopril 20 MG tablet Commonly known as:  PRINIVIL,ZESTRIL   metFORMIN 500 MG 24 hr tablet Commonly known as:  GLUCOPHAGE-XR Take 2,000 mg by mouth at bedtime.   pravastatin 20 MG tablet Commonly known as:  PRAVACHOL daily.   PROVENTIL HFA 108 (90 Base) MCG/ACT inhaler Generic drug:  albuterol Inhale into the lungs.   rOPINIRole 0.5 MG tablet Commonly known as:  REQUIP Take 0.5 mg by mouth at bedtime.   rOPINIRole 1 MG tablet Commonly known as:  REQUIP Take 1 mg by mouth.   sertraline 100 MG tablet Commonly known as:  ZOLOFT Take 100 mg by mouth 2 (two) times daily.   tamsulosin 0.4 MG Caps capsule Commonly known as:  FLOMAX Take 0.4 mg by mouth at bedtime.       Past Medical History:  Diagnosis Date  . Asthma   . GERD (gastroesophageal reflux disease)     Past Surgical History:  Procedure Laterality Date  . APPENDECTOMY  2009    Review of systems negative except as noted in HPI / PMHx or noted below:  Review of Systems  Constitutional: Negative.   HENT: Negative.   Eyes: Negative.   Respiratory: Negative.   Cardiovascular: Negative.   Gastrointestinal: Negative.   Genitourinary: Negative.   Musculoskeletal: Negative.   Skin: Negative.   Neurological: Negative.   Endo/Heme/Allergies: Negative.   Psychiatric/Behavioral: Negative.  Objective:   Vitals:   03/14/18 1128 03/14/18 1159  BP: (!) 150/90 118/84  Pulse: (!) 56   Resp: 16           Physical Exam  Constitutional:  Obese  HENT:  Head: Normocephalic.  Right Ear: Tympanic membrane, external ear and ear canal normal.  Left Ear: Tympanic membrane, external ear and ear canal normal.  Nose: Nose normal. No mucosal edema or rhinorrhea.  Mouth/Throat: Uvula is midline, oropharynx is clear and moist and mucous membranes are normal. No oropharyngeal exudate.  Eyes: Conjunctivae are normal.  Neck: Trachea normal. No tracheal tenderness present. No tracheal  deviation present. No thyromegaly present.  Cardiovascular: Normal rate, regular rhythm, S1 normal, S2 normal and normal heart sounds.  No murmur heard. Pulmonary/Chest: Breath sounds normal. No stridor. No respiratory distress. He has no wheezes. He has no rales.  Musculoskeletal: He exhibits no edema.  Lymphadenopathy:       Head (right side): No tonsillar adenopathy present.       Head (left side): No tonsillar adenopathy present.    He has no cervical adenopathy.  Neurological: He is alert.  Skin: No rash noted. He is not diaphoretic. No erythema. Nails show no clubbing.    Diagnostics:    Spirometry was performed and demonstrated an FEV1 of 2.60 at 77 % of predicted.  The patient had an Asthma Control Test with the following results: ACT Total Score: 17.    Assessment and Plan:   1. Asthma, moderate persistent, well-controlled   2. Other allergic rhinitis   3. OSA on CPAP   4. LPRD (laryngopharyngeal reflux disease)     1. Continue Dexilant 60 one tablet one time a day in morning.  Can add OTC Famotidine 40mg  mg in evening  2. Continue Symbicort 160 two inhalations two times per day   3. Can use OTC Nasacort 1-2 sprays each nostril 3-7 times per week  4. Continue Provental HFA if needed.  5. Return in 6 months or earlier if problem  6. Get a flu vaccine  I think that Jovin's respiratory status is stable.  He does have this sensation of intermittent chest tightness and I have asked him to discuss this issue with his primary care doctor during his upcoming visit to see if he would require any further evaluation for coronary artery disease.  In the past he has seen a cardiologist but that was several years ago.  He will continue to use anti-inflammatory medications for his respiratory tract as noted above and continue on CPAP.  He can add an famotidine to his proton pump inhibitor to treat with some of his breakthrough reflux issues.  I will see him back in this clinic in 6  months or earlier if there is a problem.   Laurette Schimke, MD Allergy / Immunology  [facility]

## 2018-03-18 ENCOUNTER — Encounter: Payer: Self-pay | Admitting: Allergy and Immunology

## 2018-03-18 DIAGNOSIS — E785 Hyperlipidemia, unspecified: Secondary | ICD-10-CM | POA: Diagnosis not present

## 2018-03-18 DIAGNOSIS — Z1331 Encounter for screening for depression: Secondary | ICD-10-CM | POA: Diagnosis not present

## 2018-03-18 DIAGNOSIS — Z6841 Body Mass Index (BMI) 40.0 and over, adult: Secondary | ICD-10-CM | POA: Diagnosis not present

## 2018-03-18 DIAGNOSIS — Z125 Encounter for screening for malignant neoplasm of prostate: Secondary | ICD-10-CM | POA: Diagnosis not present

## 2018-03-18 DIAGNOSIS — Z0000 Encounter for general adult medical examination without abnormal findings: Secondary | ICD-10-CM | POA: Diagnosis not present

## 2018-03-18 DIAGNOSIS — E1165 Type 2 diabetes mellitus with hyperglycemia: Secondary | ICD-10-CM | POA: Diagnosis not present

## 2018-03-18 DIAGNOSIS — Z79899 Other long term (current) drug therapy: Secondary | ICD-10-CM | POA: Diagnosis not present

## 2018-03-18 DIAGNOSIS — Z23 Encounter for immunization: Secondary | ICD-10-CM | POA: Diagnosis not present

## 2018-04-19 DIAGNOSIS — G4733 Obstructive sleep apnea (adult) (pediatric): Secondary | ICD-10-CM | POA: Diagnosis not present

## 2018-05-30 ENCOUNTER — Ambulatory Visit: Payer: Medicare PPO | Admitting: Neurology

## 2018-06-03 ENCOUNTER — Encounter: Payer: Self-pay | Admitting: *Deleted

## 2018-06-04 ENCOUNTER — Encounter: Payer: Self-pay | Admitting: Neurology

## 2018-06-04 ENCOUNTER — Ambulatory Visit: Payer: Medicare PPO | Admitting: Neurology

## 2018-06-04 VITALS — BP 156/87 | HR 71 | Ht 69.0 in | Wt 338.0 lb

## 2018-06-04 DIAGNOSIS — M545 Low back pain, unspecified: Secondary | ICD-10-CM | POA: Insufficient documentation

## 2018-06-04 DIAGNOSIS — R202 Paresthesia of skin: Secondary | ICD-10-CM | POA: Diagnosis not present

## 2018-06-04 DIAGNOSIS — M542 Cervicalgia: Secondary | ICD-10-CM

## 2018-06-04 DIAGNOSIS — R269 Unspecified abnormalities of gait and mobility: Secondary | ICD-10-CM

## 2018-06-04 HISTORY — DX: Paresthesia of skin: R20.2

## 2018-06-04 HISTORY — DX: Unspecified abnormalities of gait and mobility: R26.9

## 2018-06-04 HISTORY — DX: Low back pain, unspecified: M54.50

## 2018-06-04 NOTE — Progress Notes (Signed)
PATIENT: Marcus Peatarrell Alguire Jr. DOB: 08-23-1955  Chief Complaint  Patient presents with  . Neck Pain    He is here with his wife, Marcus Harding.  Reports several injuries, in the past to his neck/head, but he never had any of them evaluated.  He is concerned about his gradual worsening gait over the last several years.  He uses a cane to assist with ambulation.  . Peripheral Neuropathy    He has pain and burning in his bilateral lower extremities.  Marland Kitchen. PCP    Marcus Harding, Marcus F. II, MD     HISTORICAL  Marcus Carolanne GrumblingWilson Jr. is a 63 year old male seen in request by his primary care physician Dr. Gwendlyn DeutscherJohn Harding for evaluation of difficulty walking, neck pain, initial evaluation was on June 04, 2017.  I have reviewed and summarized the referring note from the referring physician.  He had a past medical history of obesity, COPD, prostate cancer, post radiation therapy in 2012.  History of smoker, quit more than 15 years ago,  He went on disability around 2010 due to progressive low back pain, gait abnormality, COPD,  Over the years, he had a gradual worsening trouble walking, getting worse especially since 2018, he complains of significant low back pain radiating pain to bilateral lower extremity, also neck pain, radiating pain to his spine, he had chronic bilateral feet numbness tingling, progressive worsening bowel and bladder urgency occasional incontinence, he denies bilateral upper extremity paresthesia, he enjoys carving wood works  He also had multiple falls, suffered right ankle fracture in 2018, per patient, he was previously suggested lumbar decompression surgery, bariatric surgery, he declined both, worried about complications, because he weakness his close friend suffered complication from the surgery.  REVIEW OF SYSTEMS: Full 14 system review of systems performed and notable only for blurred vision, shortness of breath, cough, wheezing, snoring, blood in stool, hearing loss, fatigue, joint pain,  cramps, aching muscles, allergy, runny nose, insomnia, sleepiness, restless leg, depression anxiety decreased energy All other review of systems were negative.  ALLERGIES: Allergies  Allergen Reactions  . Cortisone Itching    Other reaction(s): ITCHING    HOME MEDICATIONS: Current Outpatient Medications  Medication Sig Dispense Refill  . albuterol (PROVENTIL HFA) 108 (90 BASE) MCG/ACT inhaler Inhale into the lungs.    . budesonide-formoterol (SYMBICORT) 160-4.5 MCG/ACT inhaler Inhale 2 puffs into the lungs 2 (two) times daily.    . celecoxib (CELEBREX) 200 MG capsule Take 200 mg by mouth daily.    . Cyanocobalamin (B-12) 1000 MCG CAPS Take 1 capsule by mouth 2 (two) times daily.    Marland Kitchen. dexlansoprazole (DEXILANT) 60 MG capsule Take 1 capsule (60 mg total) by mouth 2 (two) times daily. (Patient taking differently: Take 60 mg by mouth daily. ) 60 capsule 5  . DULoxetine (CYMBALTA) 30 MG capsule Take 60 mg by mouth daily.    Marland Kitchen. gabapentin (NEURONTIN) 400 MG capsule Take 1 capsule by mouth 3 (three) times daily.  1  . lisinopril (PRINIVIL,ZESTRIL) 20 MG tablet     . metFORMIN (GLUCOPHAGE-XR) 500 MG 24 hr tablet Take 2,000 mg by mouth at bedtime.     . pravastatin (PRAVACHOL) 20 MG tablet daily.  4  . rOPINIRole (REQUIP) 0.5 MG tablet Take 0.5 mg by mouth at bedtime.    Marland Kitchen. rOPINIRole (REQUIP) 1 MG tablet Take 1 mg by mouth.    . tamsulosin (FLOMAX) 0.4 MG CAPS capsule Take 0.4 mg by mouth at bedtime.  2   No current  facility-administered medications for this visit.     PAST MEDICAL HISTORY: Past Medical History:  Diagnosis Date  . Abnormal gait   . Arthritis   . Asthma   . BPH (benign prostatic hyperplasia)   . Chronic low back pain   . COPD (chronic obstructive pulmonary disease) (HCC)   . Diabetes (HCC)   . GERD (gastroesophageal reflux disease)   . HTN (hypertension)   . Neck pain   . Neuropathy   . Peptic ulcer hemorrhagic   . RLS (restless legs syndrome)     PAST SURGICAL  HISTORY: Past Surgical History:  Procedure Laterality Date  . APPENDECTOMY  2009    FAMILY HISTORY: Family History  Problem Relation Age of Onset  . Diabetes Maternal Grandmother   . Breast cancer Paternal Grandmother   . Hypertension Mother   . Suicidality Father        age 77  . Hypertension Brother     SOCIAL HISTORY: Social History   Socioeconomic History  . Marital status: Married    Spouse name: Not on file  . Number of children: 0  . Years of education: 81  . Highest education level: High school graduate  Occupational History  . Occupation: disabled  Social Needs  . Financial resource strain: Not on file  . Food insecurity:    Worry: Not on file    Inability: Not on file  . Transportation needs:    Medical: Not on file    Non-medical: Not on file  Tobacco Use  . Smoking status: Former Smoker    Packs/day: 5.00    Years: 7.00    Pack years: 35.00    Types: Cigars, Cigarettes    Last attempt to quit: 04/25/1979    Years since quitting: 39.1  . Smokeless tobacco: Former Neurosurgeon    Types: Snuff    Quit date: 03/18/2005  Substance and Sexual Activity  . Alcohol use: Yes    Alcohol/week: 0.0 standard drinks    Comment: rare - very small amt  . Drug use: No  . Sexual activity: Not on file  Lifestyle  . Physical activity:    Days per week: Not on file    Minutes per session: Not on file  . Stress: Not on file  Relationships  . Social connections:    Talks on phone: Not on file    Gets together: Not on file    Attends religious service: Not on file    Active member of club or organization: Not on file    Attends meetings of clubs or organizations: Not on file    Relationship status: Not on file  . Intimate partner violence:    Fear of current or ex partner: Not on file    Emotionally abused: Not on file    Physically abused: Not on file    Forced sexual activity: Not on file  Other Topics Concern  . Not on file  Social History Narrative   Lives at  home with wife.   Left-handed.   1 cup coffee, 1-2 soft drinks per day.     PHYSICAL EXAM   Vitals:   06/04/18 0954  BP: (!) 156/87  Pulse: 71  Weight: (!) 338 lb (153.3 kg)  Height: 5\' 9"  (1.753 m)    Not recorded      Body mass index is 49.91 kg/m.  PHYSICAL EXAMNIATION:  Gen: NAD, conversant, well nourised, obese, well groomed  Cardiovascular: Regular rate rhythm, no peripheral edema, warm, nontender. Eyes: Conjunctivae clear without exudates or hemorrhage Neck: Supple, no carotid bruits. Pulmonary: Clear to auscultation bilaterally   NEUROLOGICAL EXAM:  MENTAL STATUS: Speech:    Speech is normal; fluent and spontaneous with normal comprehension.  Cognition:     Orientation to time, place and person     Normal recent and remote memory     Normal Attention span and concentration     Normal Language, naming, repeating,spontaneous speech     Fund of knowledge   CRANIAL NERVES: CN Harding: Visual fields are full to confrontation.  Pupils are round equal and briskly reactive to light. CN III, IV, VI: extraocular movement are normal. No ptosis. CN V: Facial sensation is intact to pinprick in all 3 divisions bilaterally. Corneal responses are intact.  CN VII: Face is symmetric with normal eye closure and smile. CN VIII: Hearing is normal to rubbing fingers CN IX, X: Palate elevates symmetrically. Phonation is normal. CN XI: Head turning and shoulder shrug are intact CN XII: Tongue is midline with normal movements and no atrophy.  MOTOR: There is no pronator drift of out-stretched arms. Muscle bulk and tone are normal. Muscle strength is normal.  With exception of mild toe flexion extension weakness.  REFLEXES: Reflexes are 1 and symmetric at the biceps, triceps, absent at knees, and ankles. Plantar responses are flexor.  SENSORY: Length dependent decreased to light touch, pinprick,  and vibratory sensation to ankle level   COORDINATION: Rapid  alternating movements and fine finger movements are intact. There is no dysmetria on finger-to-nose and heel-knee-shin.    GAIT/STANCE: He needs pushed up to get up from seated position, wide-based, cautious, difficulty stand up on tiptoe and heel walking,  DIAGNOSTIC DATA (LABS, IMAGING, TESTING) - I reviewed patient records, labs, notes, testing and imaging myself where available.   ASSESSMENT AND PLAN  Kiandre Romeo Gabhart. is a 63 y.o. 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 Ph.D.  [facility] [address] [place], [place] [address] Ph: [phone] Fax: [phone]  CC: Referring Provider

## 2018-06-05 ENCOUNTER — Telehealth: Payer: Self-pay | Admitting: Neurology

## 2018-06-05 NOTE — Telephone Encounter (Signed)
[employer] pedning faxed clinical notes

## 2018-06-07 NOTE — Telephone Encounter (Signed)
Francine Graven Berkley Harvey: [phone] (exp. 06/05/18 to 07/05/18) order sent to GI they will reach out to the patient to schedule.

## 2018-06-10 ENCOUNTER — Other Ambulatory Visit: Payer: Self-pay | Admitting: Neurology

## 2018-06-10 DIAGNOSIS — S0540XA Penetrating wound of orbit with or without foreign body, unspecified eye, initial encounter: Secondary | ICD-10-CM

## 2018-06-14 ENCOUNTER — Other Ambulatory Visit: Payer: Self-pay

## 2018-06-25 ENCOUNTER — Ambulatory Visit
Admission: RE | Admit: 2018-06-25 | Discharge: 2018-06-25 | Disposition: A | Discharge status: Home / Self Care | Payer: Medicare PPO | Source: Ambulatory Visit | Attending: Neurology | Admitting: Neurology

## 2018-06-25 ENCOUNTER — Other Ambulatory Visit: Payer: Self-pay

## 2018-06-25 DIAGNOSIS — M545 Low back pain, unspecified: Secondary | ICD-10-CM

## 2018-06-25 DIAGNOSIS — R269 Unspecified abnormalities of gait and mobility: Secondary | ICD-10-CM

## 2018-06-25 DIAGNOSIS — R202 Paresthesia of skin: Secondary | ICD-10-CM

## 2018-06-25 DIAGNOSIS — M542 Cervicalgia: Secondary | ICD-10-CM | POA: Diagnosis not present

## 2018-06-25 DIAGNOSIS — S0540XA Penetrating wound of orbit with or without foreign body, unspecified eye, initial encounter: Secondary | ICD-10-CM

## 2018-06-25 DIAGNOSIS — Z01818 Encounter for other preprocedural examination: Secondary | ICD-10-CM | POA: Diagnosis not present

## 2018-06-27 ENCOUNTER — Telehealth: Payer: Self-pay | Admitting: Neurology

## 2018-06-27 NOTE — Telephone Encounter (Signed)
Spoke to patient and notified him of the MRI results below.  He has a pending NCV/EMG appt on 07/12/2018 and would like to further review with Dr. Terrace Arabia at that time.

## 2018-06-27 NOTE — Telephone Encounter (Signed)
Please call patient, MRI of the cervical spine showed evidence of mild degenerative changes, there is no evidence of significant canal or foraminal narrowing,  MRI of lumbar spine showed spondylolisthesis of L5 on S1, extensive bone spurring, osteophyte disc complex at L5-S1 with severe bilateral foraminal stenosis,  I will review films with him at next follow-up visit   IMPRESSION:   MRI cervical spine (without) demonstrating:  - At C5-6: facet hypertrophy with mild right foraminal stenosis. - At C3-4: small posterior disc protrusion with no spinal stenosis or foraminal narrowing.   MRI lumbar spine (without) demonstrating: - Grade 3 anterior spondylolisthesis of L5 on S1 measuring 16 mm.  Extensive anterior bone spurring and osteophyte disc complex at L5-S1. This results in severe biforaminal stenosis.

## 2018-07-12 ENCOUNTER — Ambulatory Visit: Payer: Medicare PPO | Admitting: Neurology

## 2018-07-12 ENCOUNTER — Other Ambulatory Visit: Payer: Self-pay

## 2018-07-12 ENCOUNTER — Ambulatory Visit (INDEPENDENT_AMBULATORY_CARE_PROVIDER_SITE_OTHER): Payer: Medicare PPO | Admitting: Neurology

## 2018-07-12 DIAGNOSIS — R202 Paresthesia of skin: Secondary | ICD-10-CM

## 2018-07-12 DIAGNOSIS — M545 Low back pain, unspecified: Secondary | ICD-10-CM

## 2018-07-12 DIAGNOSIS — R269 Unspecified abnormalities of gait and mobility: Secondary | ICD-10-CM | POA: Diagnosis not present

## 2018-07-12 NOTE — Progress Notes (Signed)
PATIENT: Marcus Harding. DOB: 1955/11/20  No chief complaint on file.    HISTORICAL  Marcus Harding. is a 63 year old male seen in request by his primary care physician Dr. Gwendlyn Deutscher for evaluation of difficulty walking, neck pain, initial evaluation was on June 04, 2017.  I have reviewed and summarized the referring note from the referring physician.  He had a past medical history of obesity, COPD, prostate cancer, post radiation therapy in 2012.  History of smoker, quit more than 15 years ago,  He went on disability around 2010 due to progressive low back pain, gait abnormality, COPD,  Over the years, he had a gradual worsening trouble walking, getting worse especially since 2018, he complains of significant low back pain radiating pain to bilateral lower extremity, also neck pain, radiating pain to his spine, he had chronic bilateral feet numbness tingling, progressive worsening bowel and bladder urgency occasional incontinence, he denies bilateral upper extremity paresthesia, he enjoys carving wood works  He also had multiple falls, suffered right ankle fracture in 2018, per patient, he was previously suggested lumbar decompression surgery, bariatric surgery, he declined both, worried about complications, because he weakness his close friend suffered complication from the surgery.  UPDATE July 12 2018: EMG/NCS showed evidence of mild chronic bilateral S1 radiculopathies, there is no evidence of acute process. There is also evidence of mild axonal peripheral neuropathy.  Patient complains of progressive worsening gait abnormalities, low back pain, bladder incontinence, which is worsened by his previous prostate cancer treatment.  He also complains of bilateral feet paresthesia, light headedness when getting up, he does have diabetes.  We personally reviewed MRI lumbar in March 2020: grade 3 anterior spondylolisthesis of L5 on S1 measuring 59mm, extensive anterior bone  spurring and osteophyte disc complex at L5-S1. This result in severe biforaminal stenosis.  MRI cervical showed multiple level degenerative changes, no evidence significant canal or foraminal stenosis.  REVIEW OF SYSTEMS: Full 14 system review of systems performed and notable only for as above All other review of systems were negative.  ALLERGIES: Allergies  Allergen Reactions  . Cortisone Itching    Other reaction(s): ITCHING    HOME MEDICATIONS: Current Outpatient Medications  Medication Sig Dispense Refill  . albuterol (PROVENTIL HFA) 108 (90 BASE) MCG/ACT inhaler Inhale into the lungs.    . budesonide-formoterol (SYMBICORT) 160-4.5 MCG/ACT inhaler Inhale 2 puffs into the lungs 2 (two) times daily.    . celecoxib (CELEBREX) 200 MG capsule Take 200 mg by mouth daily.    . Cyanocobalamin (B-12) 1000 MCG CAPS Take 1 capsule by mouth 2 (two) times daily.    Marland Kitchen dexlansoprazole (DEXILANT) 60 MG capsule Take 1 capsule (60 mg total) by mouth 2 (two) times daily. (Patient taking differently: Take 60 mg by mouth daily. ) 60 capsule 5  . DULoxetine (CYMBALTA) 30 MG capsule Take 60 mg by mouth daily.    Marland Kitchen gabapentin (NEURONTIN) 400 MG capsule Take 1 capsule by mouth 3 (three) times daily.  1  . lisinopril (PRINIVIL,ZESTRIL) 20 MG tablet     . metFORMIN (GLUCOPHAGE-XR) 500 MG 24 hr tablet Take 2,000 mg by mouth at bedtime.     . pravastatin (PRAVACHOL) 20 MG tablet daily.  4  . rOPINIRole (REQUIP) 0.5 MG tablet Take 0.5 mg by mouth at bedtime.    Marland Kitchen rOPINIRole (REQUIP) 1 MG tablet Take 1 mg by mouth.    . tamsulosin (FLOMAX) 0.4 MG CAPS capsule Take 0.4 mg by mouth at bedtime.  2   No current facility-administered medications for this visit.     PAST MEDICAL HISTORY: Past Medical History:  Diagnosis Date  . Abnormal gait   . Arthritis   . Asthma   . BPH (benign prostatic hyperplasia)   . Chronic low back pain   . COPD (chronic obstructive pulmonary disease) (HCC)   . Diabetes (HCC)    . GERD (gastroesophageal reflux disease)   . HTN (hypertension)   . Neck pain   . Neuropathy   . Peptic ulcer hemorrhagic   . RLS (restless legs syndrome)     PAST SURGICAL HISTORY: Past Surgical History:  Procedure Laterality Date  . APPENDECTOMY  2009    FAMILY HISTORY: Family History  Problem Relation Age of Onset  . Diabetes Maternal Grandmother   . Breast cancer Paternal Grandmother   . Hypertension Mother   . Suicidality Father        age 61  . Hypertension Brother     SOCIAL HISTORY: Social History   Socioeconomic History  . Marital status: Married    Spouse name: Not on file  . Number of children: 0  . Years of education: 72  . Highest education level: High school graduate  Occupational History  . Occupation: disabled  Social Needs  . Financial resource strain: Not on file  . Food insecurity:    Worry: Not on file    Inability: Not on file  . Transportation needs:    Medical: Not on file    Non-medical: Not on file  Tobacco Use  . Smoking status: Former Smoker    Packs/day: 5.00    Years: 7.00    Pack years: 35.00    Types: Cigars, Cigarettes    Last attempt to quit: 04/25/1979    Years since quitting: 39.2  . Smokeless tobacco: Former Neurosurgeon    Types: Snuff    Quit date: 03/18/2005  Substance and Sexual Activity  . Alcohol use: Yes    Alcohol/week: 0.0 standard drinks    Comment: rare - very small amt  . Drug use: No  . Sexual activity: Not on file  Lifestyle  . Physical activity:    Days per week: Not on file    Minutes per session: Not on file  . Stress: Not on file  Relationships  . Social connections:    Talks on phone: Not on file    Gets together: Not on file    Attends religious service: Not on file    Active member of club or organization: Not on file    Attends meetings of clubs or organizations: Not on file    Relationship status: Not on file  . Intimate partner violence:    Fear of current or ex partner: Not on file     Emotionally abused: Not on file    Physically abused: Not on file    Forced sexual activity: Not on file  Other Topics Concern  . Not on file  Social History Narrative   Lives at home with wife.   Left-handed.   1 cup coffee, 1-2 soft drinks per day.     PHYSICAL EXAM   There were no vitals filed for this visit.  Not recorded      There is no height or weight on file to calculate BMI.  PHYSICAL EXAMNIATION:  Gen: NAD, conversant, well nourised, obese, well groomed  Cardiovascular: Regular rate rhythm, no peripheral edema, warm, nontender. Eyes: Conjunctivae clear without exudates or hemorrhage Neck: Supple, no carotid bruits. Pulmonary: Clear to auscultation bilaterally   NEUROLOGICAL EXAM:  MENTAL STATUS: Speech:    Speech is normal; fluent and spontaneous with normal comprehension.  Cognition:     Orientation to time, place and person     Normal recent and remote memory     Normal Attention span and concentration     Normal Language, naming, repeating,spontaneous speech     Fund of knowledge   CRANIAL NERVES: CN II: Visual fields are full to confrontation.  Pupils are round equal and briskly reactive to light. CN III, IV, VI: extraocular movement are normal. No ptosis. CN V: Facial sensation is intact to pinprick in all 3 divisions bilaterally. Corneal responses are intact.  CN VII: Face is symmetric with normal eye closure and smile. CN VIII: Hearing is normal to rubbing fingers CN IX, X: Palate elevates symmetrically. Phonation is normal. CN XI: Head turning and shoulder shrug are intact CN XII: Tongue is midline with normal movements and no atrophy.  MOTOR: There is no pronator drift of out-stretched arms. Muscle bulk and tone are normal. Muscle strength is normal.  With exception of mild toe flexion extension weakness.  REFLEXES: Reflexes are 1 and symmetric at the biceps, triceps, absent at knees, and ankles. Plantar responses are  flexor.  SENSORY: Length dependent decreased to light touch, pinprick,  and vibratory sensation to ankle level   COORDINATION: Rapid alternating movements and fine finger movements are intact. There is no dysmetria on finger-to-nose and heel-knee-shin.    GAIT/STANCE: He needs pushed up to get up from seated position, wide-based, cautious, difficulty stand up on tiptoe and heel walking,  DIAGNOSTIC DATA (LABS, IMAGING, TESTING) - I reviewed patient records, labs, notes, testing and imaging myself where available.   ASSESSMENT AND PLAN  Jackson Legrand Lasser. is a 63 y.o. male  Grade 3 anterior spondyolisthesis of L5 on S1.  Chronic S1 radiculopathy.  Progressive worsening low back pain, calf muscle cramping, urinary incontinence, gait abnormalities.  Refer him to neurosurgeon.  His gait abnormalities are multifactorial,  due to his obesity, deconditioning, chronic low back.  Dizziness:  Likely related to his deconditioning, obesity, orthostatic blood pressure changes from DM peripheral neuropathy   Levert Feinstein, M.D. Ph.D.  [facility] [address], [address] [place], [place] [address] Ph: [phone] Fax: [phone]  CC: Referring Provider

## 2018-07-12 NOTE — Procedures (Signed)
Full Name: Marcus Harding Gender: Male MRN #: [id number] Date of Birth: September 26, 1955    Visit Date: 07/12/2018 11:18 Age: 63 Years 7 Months Old Examining Physician: Levert Feinstein, MD  Referring Physician: Levert Feinstein, MD History: 63 years old male presented with chronic low back pain, radiating pain to bilateral lower extremity, frequent calf muscle spasm.  Summary of tests:  Nerve Conduction Studies: Bilateral sural sensory responses were normal. Bilateral superfacial peroneal sensory responses showed moderately decreased SNAP amplitude, with moderately increased peak latency at right side.   Bilateral tibial and right peroneal motor responses were normal. Left peroneal motor responses showed mildly decreased CMAP amplitude.  Electromyograph: Selected needled examinations of bilateral lower extremity muscles and lumbosacral paraspinal muscles were performed.   There is mild chronic neuropathic changes were noted at bilateral S1 myotomes.   Conclusion: This is an abnormal studies. There is mild bilateral lumbosacral radiculopathies, mainly at bilateral S1 myotomes.  There is no evidence of active process.  There is also evidence of mild axonal peripheral neuropathy.     ------------------------------- Levert Feinstein, M.D. Ph.D.  [facility] [address] [place], [place] [address] Tel: [phone] Fax: [phone]        [facility]    Nerve / Sites Muscle Latency Ref. Amplitude Ref. Rel Amp Segments Distance Velocity Ref. Area    ms ms mV mV %  cm m/s m/s mVms  L Peroneal - EDB     Ankle EDB 5.5 ?6.5 1.7 ?2.0 100 Ankle - EDB 9   5.6     Fib head EDB 14.0  1.3  77.6 Fib head - Ankle 32 38 ?44 5.8     Pop fossa EDB 16.7  0.7  50.7 Pop fossa - Fib head 10 37 ?44 2.3         Pop fossa - Ankle      R Peroneal - EDB     Ankle EDB 5.7 ?6.5 2.1 ?2.0 100 Ankle - EDB 9   7.2     Fib head EDB 14.9  1.5  69.3 Fib head - Ankle 36 39 ?44 5.9     Pop fossa EDB 17.5  1.3   92.1 Pop fossa - Fib head 10 39 ?44 5.6         Pop fossa - Ankle      L Tibial - AH     Ankle AH 2.7 ?5.8 5.8 ?4.0 100 Ankle - AH 9   20.0     Pop fossa AH 14.6  3.0  51.5 Pop fossa - Ankle 42 35 ?41 11.1  R Tibial - AH     Ankle AH 3.9 ?5.8 8.1 ?4.0 100 Ankle - AH 9   25.1     Pop fossa AH 14.7  5.9  72.6 Pop fossa - Ankle 41 38 ?41 21.5             SNC    Nerve / Sites Rec. Site Peak Lat Ref.  Amp Ref. Segments Distance    ms ms V V  cm  L Sural - Ankle (Calf)     Calf Ankle 4.2 ?4.4 6 ?6 Calf - Ankle 14  R Sural - Ankle (Calf)     Calf Ankle 3.4 ?4.4 6 ?6 Calf - Ankle 14  L Superficial peroneal - Ankle     Lat leg Ankle 3.7 ?4.4 3 ?6 Lat leg - Ankle 14  R Superficial peroneal - Ankle  Lat leg Ankle 4.7 ?4.4 3 ?6 Lat leg - Ankle 14              F  Wave    Nerve F Lat Ref.   ms ms  L Tibial - AH 63.0 ?56.0  R Tibial - AH 62.9 ?56.0         EMG full       EMG Summary Table    Spontaneous MUAP Recruitment  Muscle IA Fib PSW Fasc Other Amp Dur. Poly Pattern  R. Tibialis anterior Normal None None None _______ Normal Normal Normal Normal  R. Tibialis posterior Normal None None None _______ Normal Normal Normal Normal  R. Peroneus longus Normal None None None _______ Normal Normal Normal Normal  R. Gastrocnemius (Medial head) Normal None None None _______ Normal Normal Normal Reduced  R. Vastus lateralis Normal None None None _______ Normal Normal Normal Normal  L. Tibialis anterior Normal None None None _______ Normal Normal Normal Normal  L. Tibialis posterior Normal None None None _______ Normal Normal Normal Normal  L. Peroneus longus Normal None None None _______ Normal Normal Normal Normal  L. Gastrocnemius (Medial head) Normal None None None _______ Normal Normal Normal Normal  L. Vastus lateralis Normal None None None _______ Normal Normal Normal Reduced  R. Biceps femoris (short head) Normal None None None _______ Normal Normal Normal Reduced  L. Biceps femoris  (long head) Normal None None None _______ Normal Normal Normal Reduced  R. Lumbar paraspinals (mid) Normal None None None _______ Normal Normal Normal Normal  R. Lumbar paraspinals (low) Normal None None None _______ Normal Normal Normal Normal  L. Lumbar paraspinals (low) Normal None None None _______ Normal Normal Normal Normal  L. Lumbar paraspinals (mid) Normal None None None _______ Normal Normal Normal Normal

## 2018-07-22 ENCOUNTER — Telehealth: Payer: Self-pay | Admitting: Neurology

## 2018-07-22 DIAGNOSIS — I10 Essential (primary) hypertension: Secondary | ICD-10-CM | POA: Diagnosis not present

## 2018-07-22 DIAGNOSIS — Z6841 Body Mass Index (BMI) 40.0 and over, adult: Secondary | ICD-10-CM | POA: Diagnosis not present

## 2018-07-22 DIAGNOSIS — E1143 Type 2 diabetes mellitus with diabetic autonomic (poly)neuropathy: Secondary | ICD-10-CM | POA: Diagnosis not present

## 2018-07-22 DIAGNOSIS — E1159 Type 2 diabetes mellitus with other circulatory complications: Secondary | ICD-10-CM | POA: Diagnosis not present

## 2018-07-22 DIAGNOSIS — E7800 Pure hypercholesterolemia, unspecified: Secondary | ICD-10-CM | POA: Diagnosis not present

## 2018-07-22 NOTE — Telephone Encounter (Signed)
Patient has apt with Neuro surgery 07/24/2018 at 9:00 am with Dr. Maurice Small . Patient is aware.

## 2018-07-25 DIAGNOSIS — M4310 Spondylolisthesis, site unspecified: Secondary | ICD-10-CM | POA: Diagnosis not present

## 2018-07-31 DIAGNOSIS — M4310 Spondylolisthesis, site unspecified: Secondary | ICD-10-CM | POA: Diagnosis not present

## 2018-09-02 DIAGNOSIS — L049 Acute lymphadenitis, unspecified: Secondary | ICD-10-CM | POA: Diagnosis not present

## 2018-09-02 DIAGNOSIS — E1143 Type 2 diabetes mellitus with diabetic autonomic (poly)neuropathy: Secondary | ICD-10-CM | POA: Diagnosis not present

## 2018-09-02 DIAGNOSIS — Z6841 Body Mass Index (BMI) 40.0 and over, adult: Secondary | ICD-10-CM | POA: Diagnosis not present

## 2018-09-02 DIAGNOSIS — E7800 Pure hypercholesterolemia, unspecified: Secondary | ICD-10-CM | POA: Diagnosis not present

## 2018-09-06 DIAGNOSIS — J449 Chronic obstructive pulmonary disease, unspecified: Secondary | ICD-10-CM | POA: Diagnosis not present

## 2018-09-06 DIAGNOSIS — I10 Essential (primary) hypertension: Secondary | ICD-10-CM | POA: Diagnosis not present

## 2018-09-06 DIAGNOSIS — Z7984 Long term (current) use of oral hypoglycemic drugs: Secondary | ICD-10-CM | POA: Diagnosis not present

## 2018-09-06 DIAGNOSIS — B0222 Postherpetic trigeminal neuralgia: Secondary | ICD-10-CM | POA: Diagnosis not present

## 2018-09-06 DIAGNOSIS — E119 Type 2 diabetes mellitus without complications: Secondary | ICD-10-CM | POA: Diagnosis not present

## 2018-09-06 DIAGNOSIS — Z79899 Other long term (current) drug therapy: Secondary | ICD-10-CM | POA: Diagnosis not present

## 2018-09-06 DIAGNOSIS — Z87891 Personal history of nicotine dependence: Secondary | ICD-10-CM | POA: Diagnosis not present

## 2018-09-12 ENCOUNTER — Ambulatory Visit: Payer: Medicare PPO | Admitting: Allergy and Immunology

## 2018-09-12 ENCOUNTER — Other Ambulatory Visit: Payer: Self-pay

## 2018-09-12 ENCOUNTER — Encounter: Payer: Self-pay | Admitting: Allergy and Immunology

## 2018-09-12 VITALS — BP 130/90 | HR 85 | Temp 97.8°F | Resp 18

## 2018-09-12 DIAGNOSIS — G4733 Obstructive sleep apnea (adult) (pediatric): Secondary | ICD-10-CM | POA: Diagnosis not present

## 2018-09-12 DIAGNOSIS — K219 Gastro-esophageal reflux disease without esophagitis: Secondary | ICD-10-CM | POA: Diagnosis not present

## 2018-09-12 DIAGNOSIS — Z9989 Dependence on other enabling machines and devices: Secondary | ICD-10-CM | POA: Diagnosis not present

## 2018-09-12 DIAGNOSIS — J3089 Other allergic rhinitis: Secondary | ICD-10-CM

## 2018-09-12 DIAGNOSIS — J4540 Moderate persistent asthma, uncomplicated: Secondary | ICD-10-CM | POA: Diagnosis not present

## 2018-09-12 NOTE — Patient Instructions (Addendum)
  1. Continue Dexilant 60 one tablet one time a day in morning.     2. Continue Symbicort 160 two inhalations two times per day   3. Can use OTC Nasacort 1-2 sprays each nostril 3-7 times per week  4. Continue Provental HFA if needed.  5. Return in 6 months or earlier if problem

## 2018-09-12 NOTE — Progress Notes (Signed)
[name]   Follow-up Note  Referring Provider: Noni Harding, Marcus F. II, MD Primary Provider: Noni Harding, Marcus F. II, MD Date of Office Visit: 09/12/2018  Subjective:   Marcus PeatDarrell Tierney Jr. (DOB: [birth year]) is a 63 y.o. male who [facility] in re-evaluation of the following:  HPI: Marcus EmperorDarrell returns to this clinic in reevaluation of asthma and allergic rhinoconjunctivitis and a history of sleep apnea and reflux.  His last visit to this clinic was 14 March 2018.  He has really done well with his respiratory tract and has not required a systemic steroid or an antibiotic for any type of airway issue and rarely uses a short acting bronchodilator.  He does not exercise because of multiple musculoskeletal issues.  He continues on Symbicort twice a day.  His nose has not really been causing him much of a problem.  He does use a nasal steroid.  His reflux is under very good control while using just Dexilant.  He continues on CPAP for his obstructive sleep apnea.  Marcus Harding developed zoster affecting either his facial nerve or trigeminal nerve requiring the administration of acyclovir at the beginning of May.  Fortunately, this appears to have resolved and he states he is over 90% better at this point.  Allergies as of 09/12/2018      Reactions   Cortisone Itching   Other reaction(s): ITCHING      Medication List      B-12 1000 MCG Caps Take 1 capsule by mouth 2 (two) times daily.   budesonide-formoterol 160-4.5 MCG/ACT inhaler Commonly known as:  SYMBICORT Inhale 2 puffs into the lungs 2 (two) times daily.   celecoxib 200 MG capsule Commonly known as:  CELEBREX Take 200 mg by mouth daily.   dexlansoprazole 60 MG capsule Commonly known as:  Dexilant Take 1 capsule (60 mg total) by mouth 2 (two) times daily.   DULoxetine 30 MG capsule Commonly known as:  CYMBALTA Take 60 mg by mouth daily.    gabapentin 400 MG capsule Commonly known as:  NEURONTIN Take 1 capsule by mouth 3 (three) times daily.   glimepiride 2 MG tablet Commonly known as:  AMARYL   lisinopril 20 MG tablet Commonly known as:  ZESTRIL   metFORMIN 500 MG 24 hr tablet Commonly known as:  GLUCOPHAGE-XR Take 2,000 mg by mouth at bedtime.   mometasone 50 MCG/ACT nasal spray Commonly known as:  NASONEX Place 2 sprays into the nose daily.   pravastatin 20 MG tablet Commonly known as:  PRAVACHOL daily.   Proventil HFA 108 (90 Base) MCG/ACT inhaler Generic drug:  albuterol Inhale into the lungs.   rOPINIRole 0.5 MG tablet Commonly known as:  REQUIP Take 0.5 mg by mouth at bedtime.   rOPINIRole 1 MG tablet Commonly known as:  REQUIP Take 1 mg by mouth.   tamsulosin 0.4 MG Caps capsule Commonly known as:  FLOMAX Take 0.4 mg by mouth at bedtime.   valACYclovir 1000 MG tablet Commonly known as:  VALTREX Take by mouth.       Past Medical History:  Diagnosis Date  . Abnormal gait   . Arthritis   . Asthma   . BPH (benign prostatic hyperplasia)   . Chronic low back pain   . COPD (chronic obstructive pulmonary disease) (HCC)   . Diabetes (HCC)   . GERD (gastroesophageal reflux disease)   . HTN (hypertension)   . Neck pain   .  Neuropathy   . Peptic ulcer hemorrhagic   . RLS (restless legs syndrome)     Past Surgical History:  Procedure Laterality Date  . APPENDECTOMY  2009    Review of systems negative except as noted in HPI / PMHx or noted below:  Review of Systems  Constitutional: Negative.   HENT: Negative.   Eyes: Negative.   Respiratory: Negative.   Cardiovascular: Negative.   Gastrointestinal: Negative.   Genitourinary: Negative.   Musculoskeletal: Negative.   Skin: Negative.   Neurological: Negative.   Endo/Heme/Allergies: Negative.   Psychiatric/Behavioral: Negative.      Objective:   Vitals:   09/12/18 1057  BP: 130/90  Pulse: 85  Resp: 18  Temp: 97.8 F  (36.6 C)  SpO2: 95%          Physical Exam Constitutional:      Appearance: He is not diaphoretic.  HENT:     Head: Normocephalic.     Right Ear: Tympanic membrane, ear canal and external ear normal.     Left Ear: Tympanic membrane, ear canal and external ear normal.     Nose: Nose normal. No mucosal edema or rhinorrhea.     Mouth/Throat:     Pharynx: Uvula midline. No oropharyngeal exudate.  Eyes:     Conjunctiva/sclera: Conjunctivae normal.  Neck:     Thyroid: No thyromegaly.     Trachea: Trachea normal. No tracheal tenderness or tracheal deviation.  Cardiovascular:     Rate and Rhythm: Normal rate and regular rhythm.     Heart sounds: Normal heart sounds, S1 normal and S2 normal. No murmur.  Pulmonary:     Effort: No respiratory distress.     Breath sounds: Normal breath sounds. No stridor. No wheezing or rales.  Lymphadenopathy:     Head:     Right side of head: No tonsillar adenopathy.     Left side of head: No tonsillar adenopathy.     Cervical: No cervical adenopathy.  Skin:    Findings: No erythema or rash.     Nails: There is no clubbing.   Neurological:     Mental Status: He is alert.     Diagnostics:    Spirometry was performed and demonstrated an FEV1 of 2.88 at 85 % of predicted.  Assessment and Plan:   1. Asthma, moderate persistent, well-controlled   2. Other allergic rhinitis   3. OSA on CPAP   4. LPRD (laryngopharyngeal reflux disease)     1. Continue Dexilant 60 one tablet one time a day in morning.     2. Continue Symbicort 160 two inhalations two times per day   3. Can use OTC Nasacort 1-2 sprays each nostril 3-7 times per week  4. Continue Provental HFA if needed.  5. Return in 6 months or earlier if problem  [place] appears to be doing very well and he will continue to utilize a collection of anti-inflammatory agents for his airway and therapy directed against reflux as noted above and I will see him back in this clinic in 6 months  or earlier if there is a problem.  Laurette Schimke, MD Allergy / Immunology [facility]

## 2018-09-17 ENCOUNTER — Encounter: Payer: Self-pay | Admitting: Allergy and Immunology

## 2018-10-03 DIAGNOSIS — E785 Hyperlipidemia, unspecified: Secondary | ICD-10-CM | POA: Diagnosis not present

## 2018-10-03 DIAGNOSIS — E1165 Type 2 diabetes mellitus with hyperglycemia: Secondary | ICD-10-CM | POA: Diagnosis not present

## 2018-10-03 DIAGNOSIS — Z6841 Body Mass Index (BMI) 40.0 and over, adult: Secondary | ICD-10-CM | POA: Diagnosis not present

## 2018-10-03 DIAGNOSIS — I10 Essential (primary) hypertension: Secondary | ICD-10-CM | POA: Diagnosis not present

## 2018-10-03 DIAGNOSIS — M1990 Unspecified osteoarthritis, unspecified site: Secondary | ICD-10-CM | POA: Diagnosis not present

## 2018-10-10 DIAGNOSIS — M25511 Pain in right shoulder: Secondary | ICD-10-CM | POA: Diagnosis not present

## 2018-10-15 DIAGNOSIS — M25511 Pain in right shoulder: Secondary | ICD-10-CM | POA: Diagnosis not present

## 2018-10-23 DIAGNOSIS — S46011D Strain of muscle(s) and tendon(s) of the rotator cuff of right shoulder, subsequent encounter: Secondary | ICD-10-CM | POA: Diagnosis not present

## 2018-12-12 DIAGNOSIS — Z01818 Encounter for other preprocedural examination: Secondary | ICD-10-CM | POA: Diagnosis not present

## 2018-12-12 DIAGNOSIS — E119 Type 2 diabetes mellitus without complications: Secondary | ICD-10-CM

## 2018-12-12 DIAGNOSIS — R079 Chest pain, unspecified: Secondary | ICD-10-CM | POA: Diagnosis not present

## 2018-12-12 DIAGNOSIS — I499 Cardiac arrhythmia, unspecified: Secondary | ICD-10-CM | POA: Diagnosis not present

## 2018-12-12 DIAGNOSIS — E1165 Type 2 diabetes mellitus with hyperglycemia: Secondary | ICD-10-CM | POA: Diagnosis not present

## 2018-12-12 HISTORY — DX: Type 2 diabetes mellitus without complications: E11.9

## 2019-01-08 DIAGNOSIS — E785 Hyperlipidemia, unspecified: Secondary | ICD-10-CM | POA: Diagnosis not present

## 2019-01-08 DIAGNOSIS — E1165 Type 2 diabetes mellitus with hyperglycemia: Secondary | ICD-10-CM | POA: Diagnosis not present

## 2019-01-08 DIAGNOSIS — Z23 Encounter for immunization: Secondary | ICD-10-CM | POA: Diagnosis not present

## 2019-01-08 DIAGNOSIS — Z6841 Body Mass Index (BMI) 40.0 and over, adult: Secondary | ICD-10-CM | POA: Diagnosis not present

## 2019-01-08 DIAGNOSIS — S46011D Strain of muscle(s) and tendon(s) of the rotator cuff of right shoulder, subsequent encounter: Secondary | ICD-10-CM | POA: Diagnosis not present

## 2019-01-08 DIAGNOSIS — M25511 Pain in right shoulder: Secondary | ICD-10-CM | POA: Diagnosis not present

## 2019-01-10 DIAGNOSIS — G8918 Other acute postprocedural pain: Secondary | ICD-10-CM | POA: Diagnosis not present

## 2019-01-10 DIAGNOSIS — S46011A Strain of muscle(s) and tendon(s) of the rotator cuff of right shoulder, initial encounter: Secondary | ICD-10-CM | POA: Diagnosis not present

## 2019-01-10 DIAGNOSIS — M25511 Pain in right shoulder: Secondary | ICD-10-CM | POA: Diagnosis not present

## 2019-01-10 DIAGNOSIS — M545 Low back pain: Secondary | ICD-10-CM | POA: Diagnosis not present

## 2019-01-10 DIAGNOSIS — G4730 Sleep apnea, unspecified: Secondary | ICD-10-CM | POA: Diagnosis not present

## 2019-01-10 DIAGNOSIS — E119 Type 2 diabetes mellitus without complications: Secondary | ICD-10-CM | POA: Diagnosis not present

## 2019-01-10 DIAGNOSIS — M75122 Complete rotator cuff tear or rupture of left shoulder, not specified as traumatic: Secondary | ICD-10-CM | POA: Diagnosis not present

## 2019-01-10 DIAGNOSIS — S46011D Strain of muscle(s) and tendon(s) of the rotator cuff of right shoulder, subsequent encounter: Secondary | ICD-10-CM | POA: Diagnosis not present

## 2019-01-10 DIAGNOSIS — G8929 Other chronic pain: Secondary | ICD-10-CM | POA: Diagnosis not present

## 2019-01-10 DIAGNOSIS — M24111 Other articular cartilage disorders, right shoulder: Secondary | ICD-10-CM | POA: Diagnosis not present

## 2019-01-10 DIAGNOSIS — M75101 Unspecified rotator cuff tear or rupture of right shoulder, not specified as traumatic: Secondary | ICD-10-CM | POA: Diagnosis not present

## 2019-01-10 DIAGNOSIS — M7521 Bicipital tendinitis, right shoulder: Secondary | ICD-10-CM | POA: Diagnosis not present

## 2019-01-10 DIAGNOSIS — J449 Chronic obstructive pulmonary disease, unspecified: Secondary | ICD-10-CM | POA: Diagnosis not present

## 2019-01-10 DIAGNOSIS — M7551 Bursitis of right shoulder: Secondary | ICD-10-CM | POA: Diagnosis not present

## 2019-01-10 DIAGNOSIS — Z7984 Long term (current) use of oral hypoglycemic drugs: Secondary | ICD-10-CM | POA: Diagnosis not present

## 2019-01-10 DIAGNOSIS — Z01818 Encounter for other preprocedural examination: Secondary | ICD-10-CM | POA: Diagnosis not present

## 2019-01-10 DIAGNOSIS — E7800 Pure hypercholesterolemia, unspecified: Secondary | ICD-10-CM | POA: Diagnosis not present

## 2019-02-05 DIAGNOSIS — E119 Type 2 diabetes mellitus without complications: Secondary | ICD-10-CM | POA: Diagnosis not present

## 2019-02-26 DIAGNOSIS — M25511 Pain in right shoulder: Secondary | ICD-10-CM | POA: Diagnosis not present

## 2019-02-26 DIAGNOSIS — M25611 Stiffness of right shoulder, not elsewhere classified: Secondary | ICD-10-CM | POA: Diagnosis not present

## 2019-02-26 DIAGNOSIS — M6281 Muscle weakness (generalized): Secondary | ICD-10-CM | POA: Diagnosis not present

## 2019-03-04 DIAGNOSIS — M25511 Pain in right shoulder: Secondary | ICD-10-CM | POA: Diagnosis not present

## 2019-03-04 DIAGNOSIS — M6281 Muscle weakness (generalized): Secondary | ICD-10-CM | POA: Diagnosis not present

## 2019-03-04 DIAGNOSIS — M25611 Stiffness of right shoulder, not elsewhere classified: Secondary | ICD-10-CM | POA: Diagnosis not present

## 2019-03-13 ENCOUNTER — Other Ambulatory Visit: Payer: Self-pay

## 2019-03-13 ENCOUNTER — Encounter: Payer: Self-pay | Admitting: Allergy and Immunology

## 2019-03-13 ENCOUNTER — Ambulatory Visit: Payer: Medicare PPO | Admitting: Allergy and Immunology

## 2019-03-13 VITALS — BP 170/100 | HR 71 | Temp 98.1°F | Resp 18

## 2019-03-13 DIAGNOSIS — M25511 Pain in right shoulder: Secondary | ICD-10-CM | POA: Diagnosis not present

## 2019-03-13 DIAGNOSIS — M25611 Stiffness of right shoulder, not elsewhere classified: Secondary | ICD-10-CM | POA: Diagnosis not present

## 2019-03-13 DIAGNOSIS — J4540 Moderate persistent asthma, uncomplicated: Secondary | ICD-10-CM

## 2019-03-13 DIAGNOSIS — M6281 Muscle weakness (generalized): Secondary | ICD-10-CM | POA: Diagnosis not present

## 2019-03-13 DIAGNOSIS — K219 Gastro-esophageal reflux disease without esophagitis: Secondary | ICD-10-CM

## 2019-03-13 DIAGNOSIS — I10 Essential (primary) hypertension: Secondary | ICD-10-CM

## 2019-03-13 DIAGNOSIS — J3089 Other allergic rhinitis: Secondary | ICD-10-CM

## 2019-03-13 DIAGNOSIS — G4733 Obstructive sleep apnea (adult) (pediatric): Secondary | ICD-10-CM

## 2019-03-13 DIAGNOSIS — Z9989 Dependence on other enabling machines and devices: Secondary | ICD-10-CM

## 2019-03-13 NOTE — Progress Notes (Signed)
[place]   Follow-up Note  Referring Provider: Noni Saupeedding, John F. II, MD Primary Provider: Noni Saupeedding, John F. II, MD Date of Office Visit: 03/13/2019  Subjective:   Marcus PeatDarrell Cudd Jr. (DOB: Sep 22, 1955) is a 63 y.o. male who [facility] in re-evaluation of the following:  HPI: Marcus Harding returns to this clinic in evaluation of asthma and allergic rhinoconjunctivitis and history of sleep apnea and history of reflux.  His last visit to this clinic was 12 Sep 2018.  He has had a very good interval of time regarding his lower airway and has not required a systemic steroid to treat an exacerbation of his asthma and rarely uses a short acting bronchodilator.  Likewise his nose has really been doing very well and he has not required an antibiotic to treat an episode of sinusitis.  Reflux has been under very good control at this point on his current therapy.  Very good control for Man means he still continues to have some occasional regurgitation events.  His CPAP is still in place.  Apparently he is coming up on the lifespan of his CPAP machine and is recommended that he get a new one but unfortunately he needs to revisit with either neurologist or pulmonologist who specializes in sleep to obtain approval for a new machine.  He would like to put this issue off for a little while as he is recovering from shoulder surgery.  He did obtain a flu vaccine.  Allergies as of 03/13/2019      Reactions   Cortisone Itching   Other reaction(s): ITCHING      Medication List      B-12 1000 MCG Caps Take 1 capsule by mouth 2 (two) times daily.   budesonide-formoterol 160-4.5 MCG/ACT inhaler Commonly known as: SYMBICORT Inhale 2 puffs into the lungs 2 (two) times daily.   celecoxib 200 MG capsule Commonly known as: CELEBREX Take 200 mg by mouth daily.   dexlansoprazole 60 MG capsule Commonly known as:  Dexilant Take 1 capsule (60 mg total) by mouth 2 (two) times daily. What changed: when to take this   DULoxetine 30 MG capsule Commonly known as: CYMBALTA Take 60 mg by mouth daily.   gabapentin 400 MG capsule Commonly known as: NEURONTIN Take 1 capsule by mouth 3 (three) times daily.   glimepiride 2 MG tablet Commonly known as: AMARYL   lisinopril 20 MG tablet Commonly known as: ZESTRIL   metFORMIN 500 MG 24 hr tablet Commonly known as: GLUCOPHAGE-XR Take 2,000 mg by mouth at bedtime.   mometasone 50 MCG/ACT nasal spray Commonly known as: NASONEX Place 2 sprays into the nose daily.   pravastatin 20 MG tablet Commonly known as: PRAVACHOL daily.   Proventil HFA 108 (90 Base) MCG/ACT inhaler Generic drug: albuterol Inhale into the lungs.   rOPINIRole 0.5 MG tablet Commonly known as: REQUIP Take 0.5 mg by mouth at bedtime.   rOPINIRole 1 MG tablet Commonly known as: REQUIP Take 1 mg by mouth.   tamsulosin 0.4 MG Caps capsule Commonly known as: FLOMAX Take 0.4 mg by mouth at bedtime.       Past Medical History:  Diagnosis Date  . Abnormal gait   . Arthritis   . Asthma   . BPH (benign prostatic hyperplasia)   . Chronic low back pain   . COPD (chronic obstructive pulmonary disease) (HCC)   . Diabetes (HCC)   . GERD (  gastroesophageal reflux disease)   . HTN (hypertension)   . Neck pain   . Neuropathy   . Peptic ulcer hemorrhagic   . RLS (restless legs syndrome)     Past Surgical History:  Procedure Laterality Date  . APPENDECTOMY  2009    Review of systems negative except as noted in HPI / PMHx or noted below:  Review of Systems  Constitutional: Negative.   HENT: Negative.   Eyes: Negative.   Respiratory: Negative.   Cardiovascular: Negative.   Gastrointestinal: Negative.   Genitourinary: Negative.   Musculoskeletal: Negative.   Skin: Negative.   Neurological: Negative.   Endo/Heme/Allergies: Negative.   Psychiatric/Behavioral: Negative.       Objective:   Vitals:   03/13/19 1116  BP: (!) 170/100  Pulse: 71  Resp: 18  Temp: 98.1 F (36.7 C)  SpO2: 95%          Physical Exam Constitutional:      Appearance: He is not diaphoretic.  HENT:     Head: Normocephalic.     Right Ear: Tympanic membrane, ear canal and external ear normal.     Left Ear: Tympanic membrane, ear canal and external ear normal.     Nose: Nose normal. No mucosal edema or rhinorrhea.     Mouth/Throat:     Pharynx: Uvula midline. No oropharyngeal exudate.  Eyes:     Conjunctiva/sclera: Conjunctivae normal.  Neck:     Thyroid: No thyromegaly.     Trachea: Trachea normal. No tracheal tenderness or tracheal deviation.  Cardiovascular:     Rate and Rhythm: Normal rate and regular rhythm.     Heart sounds: Normal heart sounds, S1 normal and S2 normal. No murmur.  Pulmonary:     Effort: No respiratory distress.     Breath sounds: Normal breath sounds. No stridor. No wheezing or rales.  Lymphadenopathy:     Head:     Right side of head: No tonsillar adenopathy.     Left side of head: No tonsillar adenopathy.     Cervical: No cervical adenopathy.  Skin:    Findings: No erythema or rash.     Nails: There is no clubbing.   Neurological:     Mental Status: He is alert.     Diagnostics:    Spirometry was performed and demonstrated an FEV1 of 1.91 at 57 % of predicted.  He had a less than optimal effort on the spirometric maneuver.    Assessment and Plan:   1. Asthma, moderate persistent, well-controlled   2. Other allergic rhinitis   3. OSA on CPAP   4. LPRD (laryngopharyngeal reflux disease)   5. Essential hypertension     1. Continue Dexilant 60 one tablet one time a day in morning.     2. Continue Symbicort 160 two inhalations two times per day   3. Can use OTC Nasacort 1-2 sprays each nostril 3-7 times per week  4. Continue Proventil HFA if needed.  5.  Continue CPAP.  Follow-up with sleep pulmonologist or neurologist.   6. Return in 6 months or earlier if problem  7.  Obtain Covid vaccine when available  8.  BP = 170/100  Marcus Harding appears to be doing relatively well on his plan which includes anti-inflammatory agents for both his upper and lower airway and the use of the proton pump inhibitor for his reflux.  We did inform him about his blood pressure of 170/100 today and obviously he needs to have this issue addressed  by his primary care doctor.  As well, he apparently needs a reevaluation for continued CPAP use with either a sleep pulmonologist or sleep neurologist and he will need to get that arranged at some point in the near future.  If he does well I will see him back in his clinic in 6 months or earlier if there is a problem.  Allena Katz, MD Allergy / Immunology [place]

## 2019-03-13 NOTE — Patient Instructions (Addendum)
  1. Continue Dexilant 60 one tablet one time a day in morning.     2. Continue Symbicort 160 two inhalations two times per day   3. Can use OTC Nasacort 1-2 sprays each nostril 3-7 times per week  4. Continue Proventil HFA if needed.  5.  Continue CPAP.  Follow-up with sleep pulmonologist or neurologist.  6. Return in 6 months or earlier if problem  7.  Obtain Covid vaccine when available  8.  BP = 170/100

## 2019-03-14 DIAGNOSIS — M25511 Pain in right shoulder: Secondary | ICD-10-CM | POA: Diagnosis not present

## 2019-03-14 DIAGNOSIS — M25611 Stiffness of right shoulder, not elsewhere classified: Secondary | ICD-10-CM | POA: Diagnosis not present

## 2019-03-14 DIAGNOSIS — M6281 Muscle weakness (generalized): Secondary | ICD-10-CM | POA: Diagnosis not present

## 2019-03-17 ENCOUNTER — Encounter: Payer: Self-pay | Admitting: Allergy and Immunology

## 2019-03-18 DIAGNOSIS — M6281 Muscle weakness (generalized): Secondary | ICD-10-CM | POA: Diagnosis not present

## 2019-03-18 DIAGNOSIS — M25611 Stiffness of right shoulder, not elsewhere classified: Secondary | ICD-10-CM | POA: Diagnosis not present

## 2019-03-18 DIAGNOSIS — M25511 Pain in right shoulder: Secondary | ICD-10-CM | POA: Diagnosis not present

## 2019-03-21 DIAGNOSIS — M6281 Muscle weakness (generalized): Secondary | ICD-10-CM | POA: Diagnosis not present

## 2019-03-21 DIAGNOSIS — M25511 Pain in right shoulder: Secondary | ICD-10-CM | POA: Diagnosis not present

## 2019-03-21 DIAGNOSIS — M25611 Stiffness of right shoulder, not elsewhere classified: Secondary | ICD-10-CM | POA: Diagnosis not present

## 2019-03-24 DIAGNOSIS — D519 Vitamin B12 deficiency anemia, unspecified: Secondary | ICD-10-CM | POA: Diagnosis not present

## 2019-03-24 DIAGNOSIS — Z125 Encounter for screening for malignant neoplasm of prostate: Secondary | ICD-10-CM | POA: Diagnosis not present

## 2019-03-24 DIAGNOSIS — M25511 Pain in right shoulder: Secondary | ICD-10-CM | POA: Diagnosis not present

## 2019-03-24 DIAGNOSIS — Z6841 Body Mass Index (BMI) 40.0 and over, adult: Secondary | ICD-10-CM | POA: Diagnosis not present

## 2019-03-24 DIAGNOSIS — M6281 Muscle weakness (generalized): Secondary | ICD-10-CM | POA: Diagnosis not present

## 2019-03-24 DIAGNOSIS — M25611 Stiffness of right shoulder, not elsewhere classified: Secondary | ICD-10-CM | POA: Diagnosis not present

## 2019-03-24 DIAGNOSIS — E785 Hyperlipidemia, unspecified: Secondary | ICD-10-CM | POA: Diagnosis not present

## 2019-03-24 DIAGNOSIS — Z0000 Encounter for general adult medical examination without abnormal findings: Secondary | ICD-10-CM | POA: Diagnosis not present

## 2019-03-26 DIAGNOSIS — M6281 Muscle weakness (generalized): Secondary | ICD-10-CM | POA: Diagnosis not present

## 2019-03-26 DIAGNOSIS — M25611 Stiffness of right shoulder, not elsewhere classified: Secondary | ICD-10-CM | POA: Diagnosis not present

## 2019-03-26 DIAGNOSIS — M25511 Pain in right shoulder: Secondary | ICD-10-CM | POA: Diagnosis not present

## 2019-03-31 DIAGNOSIS — Z9989 Dependence on other enabling machines and devices: Secondary | ICD-10-CM | POA: Diagnosis not present

## 2019-03-31 DIAGNOSIS — Z6841 Body Mass Index (BMI) 40.0 and over, adult: Secondary | ICD-10-CM | POA: Diagnosis not present

## 2019-03-31 DIAGNOSIS — E1165 Type 2 diabetes mellitus with hyperglycemia: Secondary | ICD-10-CM | POA: Diagnosis not present

## 2019-03-31 DIAGNOSIS — E7800 Pure hypercholesterolemia, unspecified: Secondary | ICD-10-CM | POA: Diagnosis not present

## 2019-03-31 DIAGNOSIS — G4733 Obstructive sleep apnea (adult) (pediatric): Secondary | ICD-10-CM | POA: Diagnosis not present

## 2019-04-03 DIAGNOSIS — M25511 Pain in right shoulder: Secondary | ICD-10-CM | POA: Diagnosis not present

## 2019-04-03 DIAGNOSIS — M6281 Muscle weakness (generalized): Secondary | ICD-10-CM | POA: Diagnosis not present

## 2019-04-03 DIAGNOSIS — M25611 Stiffness of right shoulder, not elsewhere classified: Secondary | ICD-10-CM | POA: Diagnosis not present

## 2019-04-09 DIAGNOSIS — M25511 Pain in right shoulder: Secondary | ICD-10-CM | POA: Diagnosis not present

## 2019-04-09 DIAGNOSIS — M6281 Muscle weakness (generalized): Secondary | ICD-10-CM | POA: Diagnosis not present

## 2019-04-09 DIAGNOSIS — M25611 Stiffness of right shoulder, not elsewhere classified: Secondary | ICD-10-CM | POA: Diagnosis not present

## 2019-04-14 DIAGNOSIS — M25511 Pain in right shoulder: Secondary | ICD-10-CM | POA: Diagnosis not present

## 2019-04-14 DIAGNOSIS — M25611 Stiffness of right shoulder, not elsewhere classified: Secondary | ICD-10-CM | POA: Diagnosis not present

## 2019-04-14 DIAGNOSIS — M6281 Muscle weakness (generalized): Secondary | ICD-10-CM | POA: Diagnosis not present

## 2019-04-22 DIAGNOSIS — M6281 Muscle weakness (generalized): Secondary | ICD-10-CM | POA: Diagnosis not present

## 2019-04-22 DIAGNOSIS — M25511 Pain in right shoulder: Secondary | ICD-10-CM | POA: Diagnosis not present

## 2019-04-22 DIAGNOSIS — M25611 Stiffness of right shoulder, not elsewhere classified: Secondary | ICD-10-CM | POA: Diagnosis not present

## 2019-04-29 DIAGNOSIS — M6281 Muscle weakness (generalized): Secondary | ICD-10-CM | POA: Diagnosis not present

## 2019-04-29 DIAGNOSIS — M25511 Pain in right shoulder: Secondary | ICD-10-CM | POA: Diagnosis not present

## 2019-04-29 DIAGNOSIS — M25611 Stiffness of right shoulder, not elsewhere classified: Secondary | ICD-10-CM | POA: Diagnosis not present

## 2019-05-06 DIAGNOSIS — M6281 Muscle weakness (generalized): Secondary | ICD-10-CM | POA: Diagnosis not present

## 2019-05-06 DIAGNOSIS — M25511 Pain in right shoulder: Secondary | ICD-10-CM | POA: Diagnosis not present

## 2019-05-06 DIAGNOSIS — M25611 Stiffness of right shoulder, not elsewhere classified: Secondary | ICD-10-CM | POA: Diagnosis not present

## 2019-05-13 DIAGNOSIS — M6281 Muscle weakness (generalized): Secondary | ICD-10-CM | POA: Diagnosis not present

## 2019-05-13 DIAGNOSIS — M25611 Stiffness of right shoulder, not elsewhere classified: Secondary | ICD-10-CM | POA: Diagnosis not present

## 2019-05-13 DIAGNOSIS — M25511 Pain in right shoulder: Secondary | ICD-10-CM | POA: Diagnosis not present

## 2019-05-20 DIAGNOSIS — M25511 Pain in right shoulder: Secondary | ICD-10-CM | POA: Diagnosis not present

## 2019-05-20 DIAGNOSIS — M6281 Muscle weakness (generalized): Secondary | ICD-10-CM | POA: Diagnosis not present

## 2019-05-20 DIAGNOSIS — M25611 Stiffness of right shoulder, not elsewhere classified: Secondary | ICD-10-CM | POA: Diagnosis not present

## 2019-05-29 DIAGNOSIS — G4733 Obstructive sleep apnea (adult) (pediatric): Secondary | ICD-10-CM | POA: Diagnosis not present

## 2019-05-29 DIAGNOSIS — J309 Allergic rhinitis, unspecified: Secondary | ICD-10-CM | POA: Diagnosis not present

## 2019-06-04 DIAGNOSIS — Z23 Encounter for immunization: Secondary | ICD-10-CM | POA: Diagnosis not present

## 2019-06-04 DIAGNOSIS — E7800 Pure hypercholesterolemia, unspecified: Secondary | ICD-10-CM | POA: Diagnosis not present

## 2019-06-04 DIAGNOSIS — S41119A Laceration without foreign body of unspecified upper arm, initial encounter: Secondary | ICD-10-CM | POA: Diagnosis not present

## 2019-06-04 DIAGNOSIS — M25511 Pain in right shoulder: Secondary | ICD-10-CM | POA: Diagnosis not present

## 2019-06-04 DIAGNOSIS — E1165 Type 2 diabetes mellitus with hyperglycemia: Secondary | ICD-10-CM | POA: Diagnosis not present

## 2019-06-04 DIAGNOSIS — Z6841 Body Mass Index (BMI) 40.0 and over, adult: Secondary | ICD-10-CM | POA: Diagnosis not present

## 2019-06-10 DIAGNOSIS — G4733 Obstructive sleep apnea (adult) (pediatric): Secondary | ICD-10-CM | POA: Diagnosis not present

## 2019-07-08 DIAGNOSIS — G4733 Obstructive sleep apnea (adult) (pediatric): Secondary | ICD-10-CM | POA: Diagnosis not present

## 2019-08-07 DIAGNOSIS — J309 Allergic rhinitis, unspecified: Secondary | ICD-10-CM | POA: Diagnosis not present

## 2019-08-07 DIAGNOSIS — G4733 Obstructive sleep apnea (adult) (pediatric): Secondary | ICD-10-CM | POA: Diagnosis not present

## 2019-08-07 DIAGNOSIS — G2581 Restless legs syndrome: Secondary | ICD-10-CM | POA: Diagnosis not present

## 2019-08-08 DIAGNOSIS — G4733 Obstructive sleep apnea (adult) (pediatric): Secondary | ICD-10-CM | POA: Diagnosis not present

## 2019-09-02 DIAGNOSIS — I10 Essential (primary) hypertension: Secondary | ICD-10-CM | POA: Diagnosis not present

## 2019-09-02 DIAGNOSIS — E1143 Type 2 diabetes mellitus with diabetic autonomic (poly)neuropathy: Secondary | ICD-10-CM | POA: Diagnosis not present

## 2019-09-02 DIAGNOSIS — Z6841 Body Mass Index (BMI) 40.0 and over, adult: Secondary | ICD-10-CM | POA: Diagnosis not present

## 2019-09-07 DIAGNOSIS — G4733 Obstructive sleep apnea (adult) (pediatric): Secondary | ICD-10-CM | POA: Diagnosis not present

## 2019-09-10 ENCOUNTER — Other Ambulatory Visit: Payer: Self-pay

## 2019-09-10 ENCOUNTER — Ambulatory Visit: Payer: Medicare PPO | Admitting: Allergy and Immunology

## 2019-09-10 ENCOUNTER — Encounter: Payer: Self-pay | Admitting: Allergy and Immunology

## 2019-09-10 VITALS — BP 138/68 | HR 68 | Resp 16 | Ht 68.5 in | Wt 326.4 lb

## 2019-09-10 DIAGNOSIS — J3089 Other allergic rhinitis: Secondary | ICD-10-CM

## 2019-09-10 DIAGNOSIS — J4540 Moderate persistent asthma, uncomplicated: Secondary | ICD-10-CM

## 2019-09-10 DIAGNOSIS — K219 Gastro-esophageal reflux disease without esophagitis: Secondary | ICD-10-CM | POA: Diagnosis not present

## 2019-09-10 NOTE — Progress Notes (Signed)
[place]   Follow-up Note  Referring Provider: Angelina Sheriff, MD Primary Provider: Angelina Sheriff, MD Date of Office Visit: 09/10/2019  Subjective:   Marcus Harding. (DOB: [birth year]) is a 64 y.o. male who returns to the [facility] on 09/10/2019 in re-evaluation of the following:  HPI: Kyser returns to this clinic in evaluation of asthma and allergic rhinoconjunctivitis and a history of sleep apnea and reflux.  His last visit to this clinic was 13 March 2019.  Overall he has had a very good interval of time regarding his respiratory tract without the need for systemic steroid or an antibiotic and rare use of a short acting bronchodilator while he continues on Symbicort 160 - 2 inhalations twice a day.Marland Kitchen  He does not really exert himself to any significant degree because of multiple musculoskeletal issues.  His nose has really been doing quite well while using Nasacort about twice a week.  His reflux is under very good control while using his Dexilant every day.  He has obtained a new CPAP machine and currently his sleep apnea is being adequately treated with his current set up.  He is not very interested in obtaining the Covid vaccination.  Allergies as of 09/10/2019      Reactions   Cortisone Itching   Other reaction(s): ITCHING      Medication List      B-12 1000 MCG Caps Take 1 capsule by mouth 2 (two) times daily.   budesonide-formoterol 160-4.5 MCG/ACT inhaler Commonly known as: SYMBICORT Inhale 2 puffs into the lungs 2 (two) times daily.   celecoxib 200 MG capsule Commonly known as: CELEBREX Take 200 mg by mouth daily.   dexlansoprazole 60 MG capsule Commonly known as: Dexilant Take 1 capsule (60 mg total) by mouth 2 (two) times daily.   DULoxetine 30 MG capsule Commonly known as: CYMBALTA Take 60 mg by mouth daily.   DULoxetine 60 MG capsule Commonly known as: CYMBALTA     gabapentin 400 MG capsule Commonly known as: NEURONTIN Take 1 capsule by mouth 3 (three) times daily.   glimepiride 2 MG tablet Commonly known as: AMARYL   lisinopril 20 MG tablet Commonly known as: ZESTRIL   metFORMIN 1000 MG tablet Commonly known as: GLUCOPHAGE   mometasone 50 MCG/ACT nasal spray Commonly known as: NASONEX Place 2 sprays into the nose daily.   pravastatin 20 MG tablet Commonly known as: PRAVACHOL daily.   Proventil HFA 108 (90 Base) MCG/ACT inhaler Generic drug: albuterol Inhale into the lungs.   rOPINIRole 1 MG tablet Commonly known as: REQUIP Take 1 mg by mouth.   tamsulosin 0.4 MG Caps capsule Commonly known as: FLOMAX Take 0.4 mg by mouth at bedtime.       Past Medical History:  Diagnosis Date  . Abnormal gait   . Arthritis   . Asthma   . BPH (benign prostatic hyperplasia)   . Chronic low back pain   . COPD (chronic obstructive pulmonary disease) ([place])   . Diabetes ([place])   . GERD (gastroesophageal reflux disease)   . HTN (hypertension)   . Neck pain   . Neuropathy   . Peptic ulcer hemorrhagic   . RLS (restless legs syndrome)     Past Surgical History:  Procedure Laterality Date  . APPENDECTOMY  2009    Review of systems negative except as noted in HPI / PMHx or noted below:  Review of  Systems  Constitutional: Negative.   HENT: Negative.   Eyes: Negative.   Respiratory: Negative.   Cardiovascular: Negative.   Gastrointestinal: Negative.   Genitourinary: Negative.   Musculoskeletal: Negative.   Skin: Negative.   Neurological: Negative.   Endo/Heme/Allergies: Negative.   Psychiatric/Behavioral: Negative.      Objective:   Vitals:   09/10/19 1057  BP: 138/68  Pulse: 68  Resp: 16  SpO2: 98%   Height: 5' 8.5" (174 cm)  Weight: (!) 326 lb 6.4 oz (148.1 kg)   Physical Exam Constitutional:      Appearance: He is not diaphoretic.  HENT:     Head: Normocephalic.     Right Ear: Tympanic membrane, ear canal and  external ear normal.     Left Ear: Tympanic membrane, ear canal and external ear normal.     Nose: Nose normal. No mucosal edema or rhinorrhea.     Mouth/Throat:     Pharynx: Uvula midline. No oropharyngeal exudate.  Eyes:     Conjunctiva/sclera: Conjunctivae normal.  Neck:     Thyroid: No thyromegaly.     Trachea: Trachea normal. No tracheal tenderness or tracheal deviation.  Cardiovascular:     Rate and Rhythm: Normal rate and regular rhythm.     Heart sounds: Normal heart sounds, S1 normal and S2 normal. No murmur.  Pulmonary:     Effort: No respiratory distress.     Breath sounds: Normal breath sounds. No stridor. No wheezing or rales.  Lymphadenopathy:     Head:     Right side of head: No tonsillar adenopathy.     Left side of head: No tonsillar adenopathy.     Cervical: No cervical adenopathy.  Skin:    Findings: No erythema or rash.     Nails: There is no clubbing.  Neurological:     Mental Status: He is alert.     Diagnostics:    Spirometry was performed and demonstrated an FEV1 of 1.89 at 57 % of predicted.  Assessment and Plan:   1. Asthma, moderate persistent, well-controlled   2. Other allergic rhinitis   3. LPRD (laryngopharyngeal reflux disease)     1. Continue Dexilant 60 one tablet one time a day in morning.     2. Continue Symbicort 160 two inhalations two times per day   3. Can use OTC Nasacort 1-2 sprays each nostril 3-7 times per week  4. Continue Proventil HFA if needed.  5. Return in 6 months or earlier if problem  6. Obtain COVID vaccine  Overall Remer is really doing quite well on his current plan and he will remain on a collection of anti-inflammatory agents for his airway and therapy directed against reflux and I will see him back in his clinic in 6 months or earlier if there is a problem.  Laurette Schimke, MD Allergy / Immunology [facility]

## 2019-09-10 NOTE — Patient Instructions (Addendum)
  1. Continue Dexilant 60 one tablet one time a day in morning.     2. Continue Symbicort 160 two inhalations two times per day   3. Can use OTC Nasacort 1-2 sprays each nostril 3-7 times per week  4. Continue Proventil HFA if needed.  5. Return in 6 months or earlier if problem  6. Obtain COVID vaccine

## 2019-09-11 ENCOUNTER — Encounter: Payer: Self-pay | Admitting: Allergy and Immunology

## 2019-10-08 DIAGNOSIS — G4733 Obstructive sleep apnea (adult) (pediatric): Secondary | ICD-10-CM | POA: Diagnosis not present

## 2019-10-23 DIAGNOSIS — Z6841 Body Mass Index (BMI) 40.0 and over, adult: Secondary | ICD-10-CM | POA: Diagnosis not present

## 2019-10-23 DIAGNOSIS — I10 Essential (primary) hypertension: Secondary | ICD-10-CM | POA: Diagnosis not present

## 2019-10-23 DIAGNOSIS — K529 Noninfective gastroenteritis and colitis, unspecified: Secondary | ICD-10-CM | POA: Diagnosis not present

## 2019-10-23 DIAGNOSIS — Z0000 Encounter for general adult medical examination without abnormal findings: Secondary | ICD-10-CM | POA: Diagnosis not present

## 2019-11-07 DIAGNOSIS — G4733 Obstructive sleep apnea (adult) (pediatric): Secondary | ICD-10-CM | POA: Diagnosis not present

## 2019-11-18 DIAGNOSIS — K529 Noninfective gastroenteritis and colitis, unspecified: Secondary | ICD-10-CM | POA: Diagnosis not present

## 2019-11-18 DIAGNOSIS — I10 Essential (primary) hypertension: Secondary | ICD-10-CM | POA: Diagnosis not present

## 2019-11-18 DIAGNOSIS — Z6841 Body Mass Index (BMI) 40.0 and over, adult: Secondary | ICD-10-CM | POA: Diagnosis not present

## 2019-11-24 DIAGNOSIS — K529 Noninfective gastroenteritis and colitis, unspecified: Secondary | ICD-10-CM | POA: Diagnosis not present

## 2019-12-03 DIAGNOSIS — Z6841 Body Mass Index (BMI) 40.0 and over, adult: Secondary | ICD-10-CM | POA: Diagnosis not present

## 2019-12-03 DIAGNOSIS — A09 Infectious gastroenteritis and colitis, unspecified: Secondary | ICD-10-CM | POA: Diagnosis not present

## 2019-12-03 DIAGNOSIS — I10 Essential (primary) hypertension: Secondary | ICD-10-CM | POA: Diagnosis not present

## 2019-12-03 DIAGNOSIS — E1165 Type 2 diabetes mellitus with hyperglycemia: Secondary | ICD-10-CM | POA: Diagnosis not present

## 2019-12-03 DIAGNOSIS — E7800 Pure hypercholesterolemia, unspecified: Secondary | ICD-10-CM | POA: Diagnosis not present

## 2019-12-08 DIAGNOSIS — G4733 Obstructive sleep apnea (adult) (pediatric): Secondary | ICD-10-CM | POA: Diagnosis not present

## 2020-01-08 DIAGNOSIS — G4733 Obstructive sleep apnea (adult) (pediatric): Secondary | ICD-10-CM | POA: Diagnosis not present

## 2020-02-07 DIAGNOSIS — G4733 Obstructive sleep apnea (adult) (pediatric): Secondary | ICD-10-CM | POA: Diagnosis not present

## 2020-02-20 DIAGNOSIS — Z6841 Body Mass Index (BMI) 40.0 and over, adult: Secondary | ICD-10-CM | POA: Diagnosis not present

## 2020-02-20 DIAGNOSIS — Z23 Encounter for immunization: Secondary | ICD-10-CM | POA: Diagnosis not present

## 2020-02-20 DIAGNOSIS — E1165 Type 2 diabetes mellitus with hyperglycemia: Secondary | ICD-10-CM | POA: Diagnosis not present

## 2020-02-20 DIAGNOSIS — E7800 Pure hypercholesterolemia, unspecified: Secondary | ICD-10-CM | POA: Diagnosis not present

## 2020-03-09 DIAGNOSIS — G4733 Obstructive sleep apnea (adult) (pediatric): Secondary | ICD-10-CM | POA: Diagnosis not present

## 2020-03-15 ENCOUNTER — Ambulatory Visit: Payer: Medicare PPO | Admitting: Allergy and Immunology

## 2020-03-15 ENCOUNTER — Encounter: Payer: Self-pay | Admitting: Allergy and Immunology

## 2020-03-15 ENCOUNTER — Other Ambulatory Visit: Payer: Self-pay

## 2020-03-15 VITALS — BP 130/62 | HR 68 | Resp 16

## 2020-03-15 DIAGNOSIS — T17308A Unspecified foreign body in larynx causing other injury, initial encounter: Secondary | ICD-10-CM

## 2020-03-15 DIAGNOSIS — J4540 Moderate persistent asthma, uncomplicated: Secondary | ICD-10-CM

## 2020-03-15 DIAGNOSIS — K219 Gastro-esophageal reflux disease without esophagitis: Secondary | ICD-10-CM

## 2020-03-15 DIAGNOSIS — J3089 Other allergic rhinitis: Secondary | ICD-10-CM | POA: Diagnosis not present

## 2020-03-15 NOTE — Patient Instructions (Addendum)
  1. Continue Dexilant 60 one tablet one time a day in morning.     2. Continue Symbicort 160 two inhalations two times per day (change???)  3. Can use OTC Nasacort 1-2 sprays each nostril 3-7 times per week  4. Continue Proventil HFA if needed.  5.  Obtain modified barium swallow for choking  6. Return in 6 months or earlier if problem  7. Monoclonal antibody infusion if infected with Covid

## 2020-03-15 NOTE — Progress Notes (Signed)
[place] - [place] - [place] - [place] - [place]   Follow-up Note  Referring Provider: Noni Saupe, MD Primary Provider: Noni Saupe, MD Date of Office Visit: 03/15/2020  Subjective:   Marcus Harding. (DOB: 12-03-1955) is a 64 y.o. male who [facility]2020 in re-evaluation of the following:  HPI: Marcus Harding returns to this clinic in evaluation of asthma and allergic rhinoconjunctivitis and history of sleep apnea and reflux.  His last visit to this clinic was 10 Sep 2019.  Overall he has done very well with his breathing.  He has not required a systemic steroid or an antibiotic.  He only uses a short acting bronchodilator if he exerst himself to any significant degree or if he sings at church.  He cannot really exert himself very much because of a very back / hip issue.  His Symbicort is becoming somewhat expensive.  He is not really sure exactly how much it cost him from his insurance company and he is wondering if we could explore this issue.  His nose has not really been causing him any problem while using some Nasacort.  He reflux is under okay control.  However, he describes an issue with choking when he eats.  This can occur 1 time per week or occur 4 times per week.  This appears to occur mostly with liquid.  He has had this problem for a while but he thinks it might be somewhat progressive.  He has seen gastroenterology in the past but that was prior to this issue becoming a more prevalent issue.  He does treat his reflux on a consistent basis.  His flu vaccine was completed this year.  He will not be receiving the Covid vaccine.  Allergies as of 03/15/2020      Reactions   Cortisone Itching   Other reaction(s): ITCHING      Medication List    B-12 1000 MCG Caps Take 1 capsule by mouth 2 (two) times daily.   Basaglar KwikPen 100 UNIT/ML Inject 10 Units into the skin at bedtime.   budesonide-formoterol  160-4.5 MCG/ACT inhaler Commonly known as: SYMBICORT Inhale 2 puffs into the lungs 2 (two) times daily.   celecoxib 200 MG capsule Commonly known as: CELEBREX Take 200 mg by mouth daily.   dexlansoprazole 60 MG capsule Commonly known as: Dexilant Take 1 capsule (60 mg total) by mouth 2 (two) times daily.   DULoxetine 60 MG capsule Commonly known as: CYMBALTA Take 60 mg by mouth daily.   glimepiride 4 MG tablet Commonly known as: AMARYL Take 4 mg by mouth in the morning and at bedtime.   lisinopril 20 MG tablet Commonly known as: ZESTRIL   pravastatin 20 MG tablet Commonly known as: PRAVACHOL daily.   Proventil HFA 108 (90 Base) MCG/ACT inhaler Generic drug: albuterol Inhale into the lungs.   rOPINIRole 1 MG tablet Commonly known as: REQUIP Take 1.5 mg by mouth.   tamsulosin 0.4 MG Caps capsule Commonly known as: FLOMAX Take 0.4 mg by mouth at bedtime.       Past Medical History:  Diagnosis Date  . Abnormal gait   . Arthritis   . Asthma   . BPH (benign prostatic hyperplasia)   . Chronic low back pain   . COPD (chronic obstructive pulmonary disease) (HCC)   . Diabetes (HCC)   . GERD (gastroesophageal reflux disease)   . HTN (hypertension)   . Neck pain   .  Neuropathy   . Peptic ulcer hemorrhagic   . RLS (restless legs syndrome)     Past Surgical History:  Procedure Laterality Date  . APPENDECTOMY  2009    Review of systems negative except as noted in HPI / PMHx or noted below:  Review of Systems  Constitutional: Negative.   HENT: Negative.   Eyes: Negative.   Respiratory: Negative.   Cardiovascular: Negative.   Gastrointestinal: Negative.   Genitourinary: Negative.   Musculoskeletal: Negative.   Skin: Negative.   Neurological: Negative.   Endo/Heme/Allergies: Negative.   Psychiatric/Behavioral: Negative.      Objective:   Vitals:   03/15/20 1043  BP: 130/62  Pulse: 68  Resp: 16  SpO2: 95%          Physical  Exam Constitutional:      Appearance: He is not diaphoretic.  HENT:     Head: Normocephalic.     Right Ear: Tympanic membrane, ear canal and external ear normal.     Left Ear: Tympanic membrane, ear canal and external ear normal.     Nose: Nose normal. No mucosal edema or rhinorrhea.     Mouth/Throat:     Pharynx: Uvula midline. No oropharyngeal exudate.  Eyes:     Conjunctiva/sclera: Conjunctivae normal.  Neck:     Thyroid: No thyromegaly.     Trachea: Trachea normal. No tracheal tenderness or tracheal deviation.  Cardiovascular:     Rate and Rhythm: Normal rate and regular rhythm.     Heart sounds: Normal heart sounds, S1 normal and S2 normal. No murmur heard.   Pulmonary:     Effort: No respiratory distress.     Breath sounds: Normal breath sounds. No stridor. No wheezing or rales.  Lymphadenopathy:     Head:     Right side of head: No tonsillar adenopathy.     Left side of head: No tonsillar adenopathy.     Cervical: No cervical adenopathy.  Skin:    Findings: No erythema or rash.     Nails: There is no clubbing.  Neurological:     Mental Status: He is alert.     Diagnostics:    Spirometry was performed and demonstrated an FEV1 of 2.10 at 64 % of predicted.   Assessment and Plan:   1. Asthma, moderate persistent, well-controlled   2. Other allergic rhinitis   3. LPRD (laryngopharyngeal reflux disease)   4. Choking, initial encounter     1. Continue Dexilant 60 one tablet one time a day in morning.     2. Continue Symbicort 160 two inhalations two times per day (change???)  3. Can use OTC Nasacort 1-2 sprays each nostril 3-7 times per week  4. Continue Proventil HFA if needed.  5.  Obtain modified barium swallow for choking  6. Return in 6 months or earlier if problem  7. Monoclonal antibody infusion if infected with Covid  Marcus Harding appears to be doing very well on his current plan directed against respiratory tract inflammation but he is having a  problem with his insurance company and we need to work through whether or not he can afford Symbicort or an alternative drug.  His reflux also appears to be okay on Dexilant.  He is having choking with drinking and that definitely needs to be evaluated and we will obtain a modified barium swallow.  He is not going to obtain the Covid vaccine and I did have a discussion with him today about quickly receiving monoclonal anti-RBD antibody infusion  if he becomes infected with Covid.  Laurette Schimke, MD Allergy / Immunology [facility]

## 2020-03-16 ENCOUNTER — Encounter: Payer: Self-pay | Admitting: Allergy and Immunology

## 2020-03-17 ENCOUNTER — Telehealth: Payer: Self-pay

## 2020-03-17 NOTE — Telephone Encounter (Signed)
[facility] called to inform us that Marcus Harding has been scheduled for his modified barium swallow on Tuesday, November 30th at 1:00 pm.   Note: no prior authorization required per insurance. Call reference # [id number]

## 2020-03-22 DIAGNOSIS — K219 Gastro-esophageal reflux disease without esophagitis: Secondary | ICD-10-CM | POA: Diagnosis not present

## 2020-03-23 DIAGNOSIS — R1310 Dysphagia, unspecified: Secondary | ICD-10-CM | POA: Diagnosis not present

## 2020-03-23 DIAGNOSIS — T17308A Unspecified foreign body in larynx causing other injury, initial encounter: Secondary | ICD-10-CM | POA: Diagnosis not present

## 2020-03-23 DIAGNOSIS — R059 Cough, unspecified: Secondary | ICD-10-CM | POA: Diagnosis not present

## 2020-03-29 DIAGNOSIS — Z6841 Body Mass Index (BMI) 40.0 and over, adult: Secondary | ICD-10-CM | POA: Diagnosis not present

## 2020-03-29 DIAGNOSIS — Z9181 History of falling: Secondary | ICD-10-CM | POA: Diagnosis not present

## 2020-03-29 DIAGNOSIS — Z131 Encounter for screening for diabetes mellitus: Secondary | ICD-10-CM | POA: Diagnosis not present

## 2020-03-29 DIAGNOSIS — Z125 Encounter for screening for malignant neoplasm of prostate: Secondary | ICD-10-CM | POA: Diagnosis not present

## 2020-03-29 DIAGNOSIS — Z0000 Encounter for general adult medical examination without abnormal findings: Secondary | ICD-10-CM | POA: Diagnosis not present

## 2020-03-29 DIAGNOSIS — Z13220 Encounter for screening for lipoid disorders: Secondary | ICD-10-CM | POA: Diagnosis not present

## 2020-03-29 DIAGNOSIS — Z79899 Other long term (current) drug therapy: Secondary | ICD-10-CM | POA: Diagnosis not present

## 2020-03-29 DIAGNOSIS — Z1331 Encounter for screening for depression: Secondary | ICD-10-CM | POA: Diagnosis not present

## 2020-03-31 DIAGNOSIS — Z20828 Contact with and (suspected) exposure to other viral communicable diseases: Secondary | ICD-10-CM | POA: Diagnosis not present

## 2020-04-06 ENCOUNTER — Telehealth: Payer: Self-pay

## 2020-04-06 DIAGNOSIS — K449 Diaphragmatic hernia without obstruction or gangrene: Secondary | ICD-10-CM | POA: Diagnosis not present

## 2020-04-06 DIAGNOSIS — D126 Benign neoplasm of colon, unspecified: Secondary | ICD-10-CM | POA: Diagnosis not present

## 2020-04-06 DIAGNOSIS — Z86010 Personal history of colonic polyps: Secondary | ICD-10-CM | POA: Diagnosis not present

## 2020-04-06 DIAGNOSIS — D122 Benign neoplasm of ascending colon: Secondary | ICD-10-CM | POA: Diagnosis not present

## 2020-04-06 DIAGNOSIS — J45909 Unspecified asthma, uncomplicated: Secondary | ICD-10-CM | POA: Diagnosis not present

## 2020-04-06 DIAGNOSIS — K259 Gastric ulcer, unspecified as acute or chronic, without hemorrhage or perforation: Secondary | ICD-10-CM | POA: Diagnosis not present

## 2020-04-06 DIAGNOSIS — I10 Essential (primary) hypertension: Secondary | ICD-10-CM | POA: Diagnosis not present

## 2020-04-06 DIAGNOSIS — R1310 Dysphagia, unspecified: Secondary | ICD-10-CM | POA: Diagnosis not present

## 2020-04-06 DIAGNOSIS — K219 Gastro-esophageal reflux disease without esophagitis: Secondary | ICD-10-CM | POA: Diagnosis not present

## 2020-04-06 DIAGNOSIS — K635 Polyp of colon: Secondary | ICD-10-CM | POA: Diagnosis not present

## 2020-04-06 DIAGNOSIS — K2960 Other gastritis without bleeding: Secondary | ICD-10-CM | POA: Diagnosis not present

## 2020-04-06 DIAGNOSIS — K317 Polyp of stomach and duodenum: Secondary | ICD-10-CM | POA: Diagnosis not present

## 2020-04-06 NOTE — Telephone Encounter (Signed)
Called and informed patient that per Dr. Lucie Leather his modified barium swallow looks ok.

## 2020-04-08 DIAGNOSIS — G4733 Obstructive sleep apnea (adult) (pediatric): Secondary | ICD-10-CM | POA: Diagnosis not present

## 2020-04-09 ENCOUNTER — Encounter: Payer: Self-pay | Admitting: *Deleted

## 2020-04-11 IMAGING — CR DG ORBITS FOR FOREIGN BODY
2 series · 2 of 2 positions shown · non-contrast
Comparison: None.

CLINICAL DATA: Metal working/exposure; clearance prior to MRI.

EXAM:
ORBITS FOR FOREIGN BODY - 2 VIEW

[w orbit pa (1 of 2)]
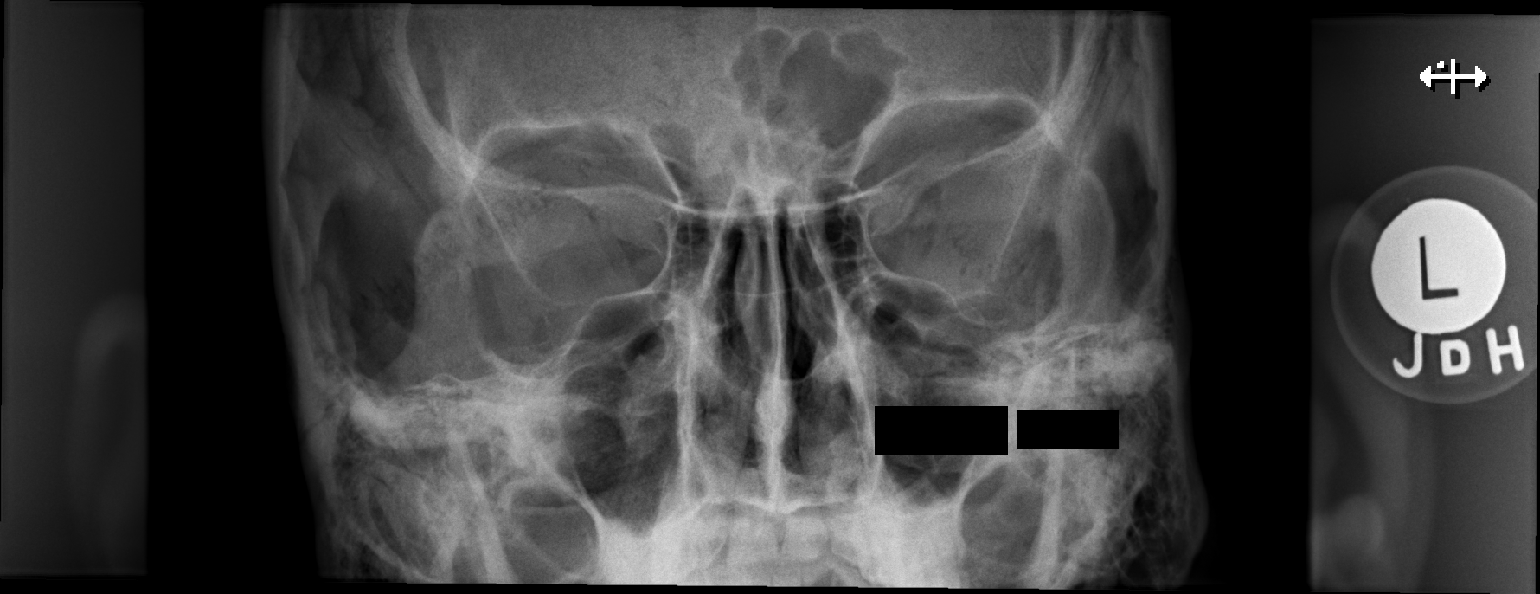

[w orbit pa (2 of 2)]
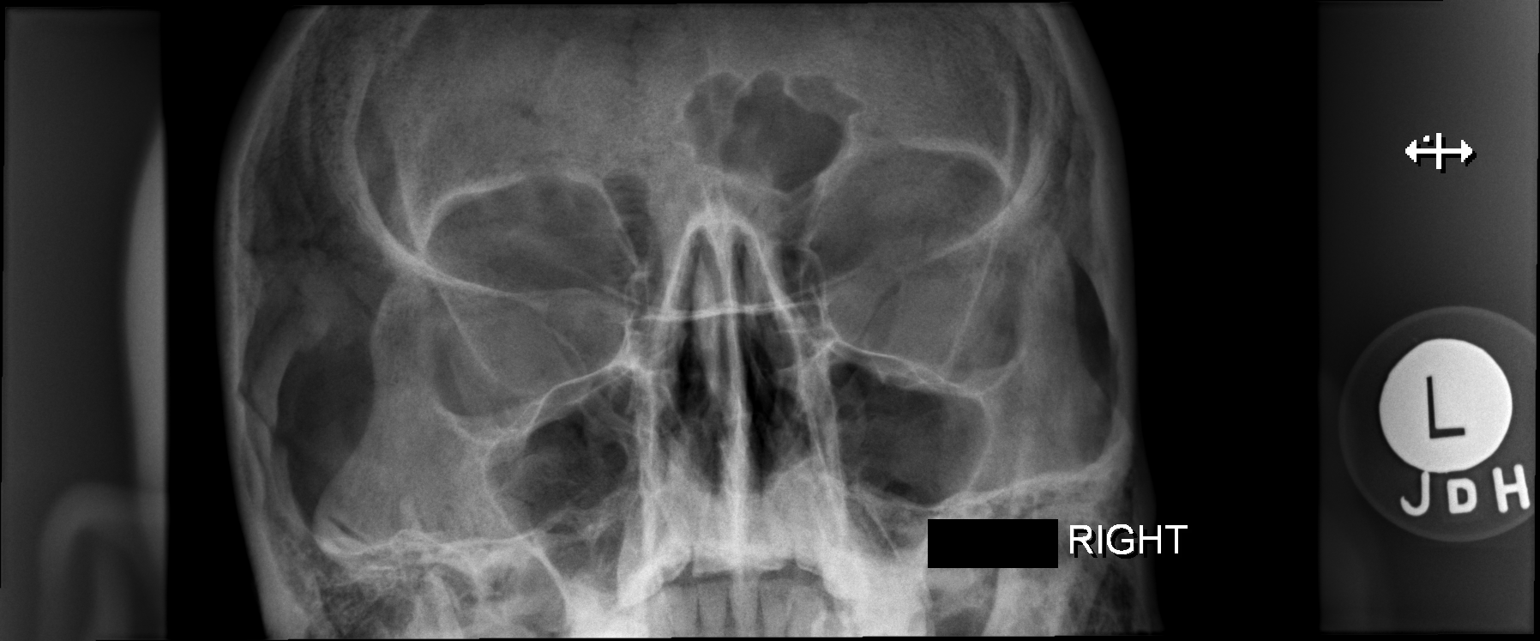

[2 of 2 positions shown; findings below may reference images not displayed]

FINDINGS: There is no evidence of metallic foreign body within the orbits. No
significant bone abnormality identified.
IMPRESSION: No evidence of metallic foreign body within the orbits.

## 2020-04-24 HISTORY — PX: ABCESS DRAINAGE: SHX399

## 2020-05-09 DIAGNOSIS — G4733 Obstructive sleep apnea (adult) (pediatric): Secondary | ICD-10-CM | POA: Diagnosis not present

## 2020-05-11 ENCOUNTER — Telehealth: Payer: Self-pay

## 2020-05-11 DIAGNOSIS — U071 COVID-19: Secondary | ICD-10-CM

## 2020-05-11 MED ORDER — PREDNISONE 10 MG PO TABS: ORAL_TABLET | ORAL | 0 refills | Status: DC

## 2020-05-11 NOTE — Addendum Note (Signed)
Addended by: Alphonzo Cruise on: 05/11/2020 05:28 PM   Modules accepted: Orders

## 2020-05-11 NOTE — Telephone Encounter (Signed)
Called and informed wife, Benay Pike.  ERX sent for both.

## 2020-05-11 NOTE — Telephone Encounter (Signed)
Patient had an appointment scheduled for tomorrow, but I have cancelled appointment and patient is planning to get COVID swab done along with his wife.  He has been having breathing problems including shortness of breath, wheezing, coughing, and his chest has been hurting.  Symptoms started three days ago.  Please advise.

## 2020-05-11 NOTE — Telephone Encounter (Signed)
Please give Marcus Harding and Marcus Harding 10 mg prednisone 1 time per day for 10 days for both of them

## 2020-05-12 ENCOUNTER — Ambulatory Visit: Payer: Medicare PPO | Admitting: Allergy and Immunology

## 2020-05-12 DIAGNOSIS — Z20822 Contact with and (suspected) exposure to covid-19: Secondary | ICD-10-CM | POA: Diagnosis not present

## 2020-05-12 NOTE — Telephone Encounter (Signed)
Called and informed wife, Benay Pike.  I have called [facility] and they are going to send over results and then will fax info to Dr. Leonia Reader.

## 2020-05-12 NOTE — Telephone Encounter (Signed)
Wife called in and states a Rapid and PCR Covid Test was performed today.  Marcus Harding tested positive for Covid on the Rapid test.  The results to the PCR is expected in 3 days.

## 2020-05-12 NOTE — Telephone Encounter (Signed)
Please refer Marcus Harding to Dr. Leonia Reader for monoclonal antibody infusion and if that cannot be completed then to [facility] health monoclonal antibody infusion center.

## 2020-05-12 NOTE — Addendum Note (Signed)
Addended by: Alphonzo Cruise on: 05/12/2020 03:22 PM   Modules accepted: Orders

## 2020-05-13 ENCOUNTER — Telehealth: Payer: Self-pay | Admitting: Allergy and Immunology

## 2020-05-13 ENCOUNTER — Telehealth: Payer: Self-pay | Admitting: Family

## 2020-05-13 ENCOUNTER — Other Ambulatory Visit: Payer: Self-pay | Admitting: Family

## 2020-05-13 ENCOUNTER — Telehealth: Payer: Self-pay | Admitting: *Deleted

## 2020-05-13 ENCOUNTER — Telehealth (HOSPITAL_COMMUNITY): Payer: Self-pay

## 2020-05-13 DIAGNOSIS — E669 Obesity, unspecified: Secondary | ICD-10-CM

## 2020-05-13 DIAGNOSIS — J4540 Moderate persistent asthma, uncomplicated: Secondary | ICD-10-CM

## 2020-05-13 DIAGNOSIS — E1165 Type 2 diabetes mellitus with hyperglycemia: Secondary | ICD-10-CM

## 2020-05-13 DIAGNOSIS — U071 COVID-19: Secondary | ICD-10-CM

## 2020-05-13 MED ORDER — SYMBICORT 160-4.5 MCG/ACT IN AERO: INHALATION_SPRAY | RESPIRATORY_TRACT | 5 refills | Status: DC

## 2020-05-13 NOTE — Telephone Encounter (Signed)
Called and spoke with wife, Benay Pike.  They need prescription faxed to [facility] at [phone] to [facility]. Will fax RX as requested.

## 2020-05-13 NOTE — Telephone Encounter (Signed)
Eman called in and states he needs a prescription for Symbicort to be sent to [facility] in [place].  The name is [facility].  He states we have sent medications to them in the past.  They offered to cover his Symbicort free of charge.  Please advise.

## 2020-05-13 NOTE — Telephone Encounter (Signed)
Called to discuss with patient about COVID-19 symptoms and the use of one of the available treatments for those with mild to moderate Covid symptoms and at a high risk of hospitalization.  Pt appears to qualify for outpatient treatment due to co-morbid conditions and/or a member of an at-risk group in accordance with the FDA Emergency Use Authorization.    Symptom onset: 1/16 Vaccinated: No Booster: No Immunocompromised: No Qualifiers: COPD, BMI, DM  RN informed pt that an APP will reach out and if there is a treatment option available will offer to them.  Essie Hart, RN

## 2020-05-13 NOTE — Progress Notes (Signed)
I connected by phone with Marcus Harding. on 05/13/2020 at 2:18 PM to discuss the potential use of a new treatment for mild to moderate COVID-19 viral infection in non-hospitalized patients.  This patient is a 65 y.o. male that meets the FDA criteria for Emergency Use Authorization of COVID monoclonal antibody sotrovimab.  Has a (+) direct SARS-CoV-2 viral test result  Has mild or moderate COVID-19   Is NOT hospitalized due to COVID-19  Is within 10 days of symptom onset  Has at least one of the high risk factor(s) for progression to severe COVID-19 and/or hospitalization as defined in EUA.  Specific high risk criteria : Older age (>/= 66 yo), BMI > 25, Diabetes and Chronic Lung Disease   I have spoken and communicated the following to the patient or parent/caregiver regarding COVID monoclonal antibody treatment:  1. FDA has authorized the emergency use for the treatment of mild to moderate COVID-19 in adults and pediatric patients with positive results of direct SARS-CoV-2 viral testing who are 41 years of age and older weighing at least 40 kg, and who are at high risk for progressing to severe COVID-19 and/or hospitalization.  2. The significant known and potential risks and benefits of COVID monoclonal antibody, and the extent to which such potential risks and benefits are unknown.  3. Information on available alternative treatments and the risks and benefits of those alternatives, including clinical trials.  4. Patients treated with COVID monoclonal antibody should continue to self-isolate and use infection control measures (e.g., wear mask, isolate, social distance, avoid sharing personal items, clean and disinfect "high touch" surfaces, and frequent handwashing) according to CDC guidelines.   5. The patient or parent/caregiver has the option to accept or refuse COVID monoclonal antibody treatment.  After reviewing this information with the patient, the patient has agreed to  receive one of the available covid 19 monoclonal antibodies and will be provided an appropriate fact sheet prior to infusion.   Jeanine Luz, FNP 05/13/2020 2:18 PM

## 2020-05-13 NOTE — Telephone Encounter (Signed)
Called to discuss with patient about COVID-19 symptoms and the use of one of the available treatments for those with mild to moderate Covid symptoms and at a high risk of hospitalization.  Pt appears to qualify for outpatient treatment due to co-morbid conditions and/or a member of an at-risk group in accordance with the FDA Emergency Use Authorization.    Symptom onset:  Vaccinated:  Booster?  Immunocompromised?  Qualifiers:   Unable to reach pt - No answer. Left VM to return call for information.  Grey Schlauch Kaye   

## 2020-05-13 NOTE — Telephone Encounter (Signed)
Called to discuss with patient about COVID-19 symptoms and the use of one of the available treatments for those with mild to moderate Covid symptoms and at a high risk of hospitalization.  Pt appears to qualify for outpatient treatment due to co-morbid conditions and/or a member of an at-risk group in accordance with the FDA Emergency Use Authorization.    Symptom onset: 1/16 Vaccinated: No Booster: No Immunocompromised: No Qualifiers: COPD, BMI, DM   I spoke with Mr. Gillen regarding the risks, benefits, and potential financial costs associated with treatment with Sotrovimab and he wishes to continue with treatment.   Hello Uday Carolanne Grumbling.,   We contacted you because you were recently diagnosed with COVID-19 and may benefit from a new treatment for mild to moderate disease. This treatment helps reduce the chance of being hospitalized. For some patients with medical conditions that may increase the chances of an infection, the treatment also decreases the risk for serious symptoms related to COVID-19.   The Food and Drug Administration (FDA) approved emergency use of a new drug to treat patients with mild to moderate symptoms who have risk factors that could cause severe symptoms related to COVID-19. This new treatment is a monoclonal antibody. It works by attaching like a magnet to the SARS-CoV2 virus (the virus that causes COVID-19) and stops it from infecting more cells in your body. It does not kill the virus, but it prevents it from spreading throughout your body with the hope that it will decrease your symptoms after it is administered.   This new drug is an intravenous (IV) infusion called Sotrovimab that is given over one 30-minute session in our [facility] outpatient infusion clinic. You will need to stay about 60 minutes after the infusion to ensure you are tolerating it well and to watch for any allergic reaction to the medication. More information will be given to you at the time of  your appointment.   Important information:  . The potential side effects: 2-4% of recipients experience nausea, vomiting, diarrhea, dizziness, headaches, itching, worsening fevers or chills for around 24 hours. . There have been no serious infusion-related reactions. . Of the more than 3,000 patients who received the infusion, only one had an allergic response that ended once the infusion was stopped. This is why we monitor all of our patients closely for 60 minutes after the infusion.  . The COVID-19 vaccine (including boosters) must be delayed at least 90 days after receiving this infusion.  . The medication itself is free, but your insurance will be charged an infusion fee. The amount you may owe later varies from insurance to insurance. If you do not have insurance, we can put you in touch with our billing department. Please contact your insurance agent to discuss prior to your appointment if you would like further details about billing specific to your policy. The CMS code is: M65  . If you have been tested outside of a [facility], you MUST bring a copy of your positive test with you the morning of your appointment. You may take a photo of this and upload to your MyChart portal,  have the testing facility fax the result to [phone] or email a copy to [email or web address].    You have been scheduled to receive the monoclonal antibody therapy at [facility]:  05/14/20 at 12:30pm    The address for the infusion clinic site is:   --The GPS address is [address], and the parking is located  near the [facility] building where you will see a "COVID19 Infusion" feather banner marking the entrance to parking. (See photos below.)            --Enter into the 2nd entrance where the "wave, flag banner" is at the road. Turn into this 2nd entrance and immediately turn left or right to park in one of the marked spaces.   --Please stay in your car and call the desk for  assistance inside at [phone]. Let us know which space you are in.    --The average time in the department is roughly 90 minutes to two hours for monoclonal treatment. This includes preparation of the medication, IV start and the required 60-minute monitoring after the infusion.     Should you develop worsening shortness of breath, chest pain or severe breathing problems, please do not wait for this appointment and go to the emergency room for evaluation and treatment instead. You will undergo another oxygen screen before your infusion to ensure this is the best treatment option for you. There is a chance that the best decision may be to send you to the emergency room for evaluation at the time of your appointment.    The day of your visit, you should: Marland Kitchen Get plenty of rest the night before and drink plenty of water. . Eat a light meal/snack before coming and take your medications as prescribed.  . Wear warm, comfortable clothes with a shirt that can roll-up over the elbow (will need IV start).  . Wear a mask.  . Consider bringing an activity to help pass the time.    Marcos Eke, NP 05/13/2020 2:16 PM

## 2020-05-14 ENCOUNTER — Ambulatory Visit (HOSPITAL_COMMUNITY)
Admission: RE | Admit: 2020-05-14 | Discharge: 2020-05-14 | Disposition: A | Discharge status: Home / Self Care | Payer: Medicare PPO | Source: Ambulatory Visit | Attending: Pulmonary Disease | Admitting: Pulmonary Disease

## 2020-05-14 DIAGNOSIS — E1165 Type 2 diabetes mellitus with hyperglycemia: Secondary | ICD-10-CM | POA: Insufficient documentation

## 2020-05-14 DIAGNOSIS — U071 COVID-19: Secondary | ICD-10-CM | POA: Insufficient documentation

## 2020-05-14 DIAGNOSIS — J4540 Moderate persistent asthma, uncomplicated: Secondary | ICD-10-CM | POA: Diagnosis not present

## 2020-05-14 DIAGNOSIS — E669 Obesity, unspecified: Secondary | ICD-10-CM | POA: Diagnosis not present

## 2020-05-14 DIAGNOSIS — Z6830 Body mass index (BMI) 30.0-30.9, adult: Secondary | ICD-10-CM | POA: Diagnosis not present

## 2020-05-14 MED ORDER — SOTROVIMAB 500 MG/8ML IV SOLN
500.0000 mg | Freq: Once | INTRAVENOUS | Status: AC
  Administered 2020-05-14: 500 mg via INTRAVENOUS

## 2020-05-14 MED ORDER — FAMOTIDINE IN NACL 20-0.9 MG/50ML-% IV SOLN: 20.0000 mg | Freq: Once | INTRAVENOUS | Status: DC | PRN

## 2020-05-14 MED ORDER — DIPHENHYDRAMINE HCL 50 MG/ML IJ SOLN: 50.0000 mg | Freq: Once | INTRAMUSCULAR | Status: DC | PRN

## 2020-05-14 MED ORDER — EPINEPHRINE 0.3 MG/0.3ML IJ SOAJ: 0.3000 mg | Freq: Once | INTRAMUSCULAR | Status: DC | PRN

## 2020-05-14 MED ORDER — ALBUTEROL SULFATE HFA 108 (90 BASE) MCG/ACT IN AERS: 2.0000 | INHALATION_SPRAY | Freq: Once | RESPIRATORY_TRACT | Status: DC | PRN

## 2020-05-14 MED ORDER — METHYLPREDNISOLONE SODIUM SUCC 125 MG IJ SOLR: 125.0000 mg | Freq: Once | INTRAMUSCULAR | Status: DC | PRN

## 2020-05-14 MED ORDER — SODIUM CHLORIDE 0.9 % IV SOLN: INTRAVENOUS | Status: DC | PRN

## 2020-05-14 NOTE — Progress Notes (Signed)
Diagnosis: COVID-19  Physician: Dr. Patrick Wright  Procedure: Covid Infusion Clinic Med: Sotrovimab infusion - Provided patient with sotrovimab fact sheet for patients, parents, and caregivers prior to infusion.   Complications: No immediate complications noted  Discharge: Discharged home    

## 2020-05-14 NOTE — Discharge Instructions (Signed)

## 2020-05-14 NOTE — Progress Notes (Signed)
Patient reviewed Fact Sheet for Patients, Parents, and Caregivers for Emergency Use Authorization (EUA) of sotrovimab for the Treatment of Coronavirus. Patient also reviewed and is agreeable to the estimated cost of treatment. Patient is agreeable to proceed.   

## 2020-05-21 DIAGNOSIS — G629 Polyneuropathy, unspecified: Secondary | ICD-10-CM | POA: Diagnosis not present

## 2020-05-21 DIAGNOSIS — B952 Enterococcus as the cause of diseases classified elsewhere: Secondary | ICD-10-CM | POA: Diagnosis not present

## 2020-05-21 DIAGNOSIS — E785 Hyperlipidemia, unspecified: Secondary | ICD-10-CM | POA: Diagnosis not present

## 2020-05-21 DIAGNOSIS — G8929 Other chronic pain: Secondary | ICD-10-CM | POA: Diagnosis not present

## 2020-05-21 DIAGNOSIS — L02216 Cutaneous abscess of umbilicus: Secondary | ICD-10-CM | POA: Diagnosis not present

## 2020-05-21 DIAGNOSIS — Z794 Long term (current) use of insulin: Secondary | ICD-10-CM | POA: Diagnosis not present

## 2020-05-21 DIAGNOSIS — G2581 Restless legs syndrome: Secondary | ICD-10-CM | POA: Diagnosis not present

## 2020-05-21 DIAGNOSIS — J449 Chronic obstructive pulmonary disease, unspecified: Secondary | ICD-10-CM | POA: Diagnosis not present

## 2020-05-21 DIAGNOSIS — K420 Umbilical hernia with obstruction, without gangrene: Secondary | ICD-10-CM | POA: Diagnosis not present

## 2020-05-21 DIAGNOSIS — E119 Type 2 diabetes mellitus without complications: Secondary | ICD-10-CM | POA: Diagnosis not present

## 2020-05-23 DIAGNOSIS — E1165 Type 2 diabetes mellitus with hyperglycemia: Secondary | ICD-10-CM | POA: Diagnosis not present

## 2020-05-23 DIAGNOSIS — E7800 Pure hypercholesterolemia, unspecified: Secondary | ICD-10-CM | POA: Diagnosis not present

## 2020-05-23 DIAGNOSIS — D519 Vitamin B12 deficiency anemia, unspecified: Secondary | ICD-10-CM | POA: Diagnosis not present

## 2020-06-09 DIAGNOSIS — G4733 Obstructive sleep apnea (adult) (pediatric): Secondary | ICD-10-CM | POA: Diagnosis not present

## 2020-06-09 DIAGNOSIS — K219 Gastro-esophageal reflux disease without esophagitis: Secondary | ICD-10-CM | POA: Diagnosis not present

## 2020-06-10 DIAGNOSIS — G4733 Obstructive sleep apnea (adult) (pediatric): Secondary | ICD-10-CM | POA: Diagnosis not present

## 2020-06-17 DIAGNOSIS — G4733 Obstructive sleep apnea (adult) (pediatric): Secondary | ICD-10-CM | POA: Diagnosis not present

## 2020-06-21 DIAGNOSIS — D519 Vitamin B12 deficiency anemia, unspecified: Secondary | ICD-10-CM | POA: Diagnosis not present

## 2020-06-21 DIAGNOSIS — E7800 Pure hypercholesterolemia, unspecified: Secondary | ICD-10-CM | POA: Diagnosis not present

## 2020-06-21 DIAGNOSIS — E1165 Type 2 diabetes mellitus with hyperglycemia: Secondary | ICD-10-CM | POA: Diagnosis not present

## 2020-07-14 DIAGNOSIS — H2513 Age-related nuclear cataract, bilateral: Secondary | ICD-10-CM | POA: Diagnosis not present

## 2020-07-14 DIAGNOSIS — E119 Type 2 diabetes mellitus without complications: Secondary | ICD-10-CM | POA: Diagnosis not present

## 2020-07-21 DIAGNOSIS — Z6841 Body Mass Index (BMI) 40.0 and over, adult: Secondary | ICD-10-CM | POA: Diagnosis not present

## 2020-07-21 DIAGNOSIS — S90851A Superficial foreign body, right foot, initial encounter: Secondary | ICD-10-CM | POA: Diagnosis not present

## 2020-07-21 DIAGNOSIS — S91331A Puncture wound without foreign body, right foot, initial encounter: Secondary | ICD-10-CM | POA: Diagnosis not present

## 2020-07-21 DIAGNOSIS — E1165 Type 2 diabetes mellitus with hyperglycemia: Secondary | ICD-10-CM | POA: Diagnosis not present

## 2020-07-21 DIAGNOSIS — L03115 Cellulitis of right lower limb: Secondary | ICD-10-CM | POA: Diagnosis not present

## 2020-07-21 DIAGNOSIS — E1143 Type 2 diabetes mellitus with diabetic autonomic (poly)neuropathy: Secondary | ICD-10-CM | POA: Diagnosis not present

## 2020-07-21 DIAGNOSIS — D510 Vitamin B12 deficiency anemia due to intrinsic factor deficiency: Secondary | ICD-10-CM | POA: Diagnosis not present

## 2020-07-21 DIAGNOSIS — E11621 Type 2 diabetes mellitus with foot ulcer: Secondary | ICD-10-CM | POA: Diagnosis not present

## 2020-07-22 DIAGNOSIS — L03115 Cellulitis of right lower limb: Secondary | ICD-10-CM | POA: Diagnosis not present

## 2020-07-22 DIAGNOSIS — Z6841 Body Mass Index (BMI) 40.0 and over, adult: Secondary | ICD-10-CM | POA: Diagnosis not present

## 2020-07-22 DIAGNOSIS — E1165 Type 2 diabetes mellitus with hyperglycemia: Secondary | ICD-10-CM | POA: Diagnosis not present

## 2020-07-23 DIAGNOSIS — E1165 Type 2 diabetes mellitus with hyperglycemia: Secondary | ICD-10-CM | POA: Diagnosis not present

## 2020-07-23 DIAGNOSIS — L03115 Cellulitis of right lower limb: Secondary | ICD-10-CM | POA: Diagnosis not present

## 2020-07-23 DIAGNOSIS — Z6841 Body Mass Index (BMI) 40.0 and over, adult: Secondary | ICD-10-CM | POA: Diagnosis not present

## 2020-07-27 DIAGNOSIS — Z794 Long term (current) use of insulin: Secondary | ICD-10-CM | POA: Diagnosis not present

## 2020-07-27 DIAGNOSIS — E1165 Type 2 diabetes mellitus with hyperglycemia: Secondary | ICD-10-CM | POA: Diagnosis not present

## 2020-07-27 DIAGNOSIS — Z6841 Body Mass Index (BMI) 40.0 and over, adult: Secondary | ICD-10-CM | POA: Diagnosis not present

## 2020-07-27 DIAGNOSIS — L0390 Cellulitis, unspecified: Secondary | ICD-10-CM | POA: Diagnosis not present

## 2020-07-27 DIAGNOSIS — Z9119 Patient's noncompliance with other medical treatment and regimen: Secondary | ICD-10-CM | POA: Diagnosis not present

## 2020-07-28 DIAGNOSIS — E1165 Type 2 diabetes mellitus with hyperglycemia: Secondary | ICD-10-CM | POA: Diagnosis not present

## 2020-08-13 DIAGNOSIS — S91309A Unspecified open wound, unspecified foot, initial encounter: Secondary | ICD-10-CM | POA: Diagnosis not present

## 2020-08-13 DIAGNOSIS — Z6841 Body Mass Index (BMI) 40.0 and over, adult: Secondary | ICD-10-CM | POA: Diagnosis not present

## 2020-08-13 DIAGNOSIS — E1143 Type 2 diabetes mellitus with diabetic autonomic (poly)neuropathy: Secondary | ICD-10-CM | POA: Diagnosis not present

## 2020-08-21 DIAGNOSIS — D510 Vitamin B12 deficiency anemia due to intrinsic factor deficiency: Secondary | ICD-10-CM | POA: Diagnosis not present

## 2020-08-21 DIAGNOSIS — E1143 Type 2 diabetes mellitus with diabetic autonomic (poly)neuropathy: Secondary | ICD-10-CM | POA: Diagnosis not present

## 2020-09-13 ENCOUNTER — Ambulatory Visit: Payer: Medicare PPO | Admitting: Allergy and Immunology

## 2020-09-13 ENCOUNTER — Encounter: Payer: Self-pay | Admitting: Allergy and Immunology

## 2020-09-13 ENCOUNTER — Other Ambulatory Visit: Payer: Self-pay

## 2020-09-13 VITALS — BP 126/78 | HR 65 | Temp 96.7°F | Resp 20 | Wt 295.0 lb

## 2020-09-13 DIAGNOSIS — K219 Gastro-esophageal reflux disease without esophagitis: Secondary | ICD-10-CM | POA: Diagnosis not present

## 2020-09-13 DIAGNOSIS — J4540 Moderate persistent asthma, uncomplicated: Secondary | ICD-10-CM | POA: Diagnosis not present

## 2020-09-13 DIAGNOSIS — J3089 Other allergic rhinitis: Secondary | ICD-10-CM | POA: Diagnosis not present

## 2020-09-13 NOTE — Progress Notes (Signed)
[place] - [place] - [place] - [place] - [place]   Follow-up Note  Referring Provider: Noni Saupe, MD Primary Provider: Noni Saupe, MD Date of Office Visit: 09/13/2020  Subjective:   Marcus Harding. (DOB: 04/16/1956) is a 65 y.o. male who [facility] in re-evaluation of the following:  HPI: Lio returns to this clinic in evaluation of asthma and allergic rhinoconjunctivitis and reflux.  His last visit to this clinic was 15 March 2020.  Overall he feels as though his asthma has been under very good control with rare use of a short acting bronchodilator and no requirement for systemic steroid to treat an exacerbation while he continues on Symbicort on a consistent basis.  Likewise his nose has been doing very well while using some over-the-counter nasal steroid and he has not required an antibiotic to treat an episode of sinusitis.  He believes that his reflux is under pretty good control though he still has some choking when he eats on occasion.  He did visit with Dr. Jennye Boroughs who performed an upper and lower endoscopy and felt that he might need surgical repair of his hiatal hernia which may be contributing to some of his symptoms.  He was infected with COVID for which he received a monoclonal antibody infusion on 14 May 2020.Marland Kitchen  He has had a very active interval of time regarding other issues including an umbilicus infection requiring an incision and drainage and then a cellulitis involving his right foot that occurred after splinter implantation requiring hospitalization for several days.  Allergies as of 09/13/2020      Reactions   Cortisone Itching   Other reaction(s): ITCHING      Medication List    albuterol 108 (90 Base) MCG/ACT inhaler Commonly known as: VENTOLIN HFA Inhale into the lungs.   B-12 1000 MCG Caps Take 1 capsule by mouth 2 (two) times daily.   Basaglar KwikPen 100  UNIT/ML Inject 50 Units into the skin at bedtime.   celecoxib 200 MG capsule Commonly known as: CELEBREX Take 200 mg by mouth daily.   dexlansoprazole 60 MG capsule Commonly known as: Dexilant Take 1 capsule (60 mg total) by mouth 2 (two) times daily.   DULoxetine 60 MG capsule Commonly known as: CYMBALTA Take 60 mg by mouth daily.   glipiZIDE 2.5 mg Tabs tablet Commonly known as: GLUCOTROL Take 2.5 mg by mouth daily before breakfast.   lisinopril 20 MG tablet Commonly known as: ZESTRIL   pravastatin 20 MG tablet Commonly known as: PRAVACHOL daily.   rOPINIRole 1 MG tablet Commonly known as: REQUIP Take 1.5 mg by mouth.   Symbicort 160-4.5 MCG/ACT inhaler Generic drug: budesonide-formoterol Inhale two puffs twice daily to prevent cough or wheeze.  Rinse, gargle, and spit after use.   tamsulosin 0.4 MG Caps capsule Commonly known as: FLOMAX Take 0.4 mg by mouth at bedtime.       Past Medical History:  Diagnosis Date  . Abnormal gait   . Arthritis   . Asthma   . BPH (benign prostatic hyperplasia)   . Chronic low back pain   . COPD (chronic obstructive pulmonary disease) (HCC)   . Diabetes (HCC)   . GERD (gastroesophageal reflux disease)   . HTN (hypertension)   . Neck pain   . Neuropathy   . Peptic ulcer hemorrhagic   . RLS (restless legs syndrome)     Past Surgical History:  Procedure Laterality Date  .  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 APPENDECTOMY  2009    Review of systems negative except as noted in HPI / PMHx or noted below:  Review of Systems  Constitutional: Negative.   HENT: Negative.   Eyes: Negative.   Respiratory: Negative.   Cardiovascular: Negative.   Gastrointestinal: Negative.   Genitourinary: Negative.   Musculoskeletal: Negative.   Skin: Negative.   Neurological: Negative.   Endo/Heme/Allergies: Negative.   Psychiatric/Behavioral: Negative.      Objective:   Vitals:   09/13/20 1105   BP: 126/78  Pulse: 65  Resp: 20  Temp: (!) 96.7 F (35.9 C)  SpO2: 98%      Weight: 295 lb (133.8 kg)   Physical Exam Constitutional:      Appearance: He is not diaphoretic.  HENT:     Head: Normocephalic.     Right Ear: Tympanic membrane, ear canal and external ear normal.     Left Ear: Tympanic membrane, ear canal and external ear normal.     Nose: Nose normal. No mucosal edema or rhinorrhea.     Mouth/Throat:     Pharynx: Uvula midline. No oropharyngeal exudate.  Eyes:     Conjunctiva/sclera: Conjunctivae normal.  Neck:     Thyroid: No thyromegaly.     Trachea: Trachea normal. No tracheal tenderness or tracheal deviation.  Cardiovascular:     Rate and Rhythm: Normal rate and regular rhythm.     Heart sounds: Normal heart sounds, S1 normal and S2 normal. No murmur heard.   Pulmonary:     Effort: No respiratory distress.     Breath sounds: Normal breath sounds. No stridor. No wheezing or rales.  Lymphadenopathy:     Head:     Right side of head: No tonsillar adenopathy.     Left side of head: No tonsillar adenopathy.     Cervical: No cervical adenopathy.  Skin:    Findings: No erythema or rash.     Nails: There is no clubbing.  Neurological:     Mental Status: He is alert.     Diagnostics:    Spirometry was performed and demonstrated an FEV1 of 2.33 at 71 % of predicted.    Results of a Modified barium swallow exam obtained 09 April 2020 identified normal swallowing mechanics without evidence of aspiration or laryngeal penetration.  Assessment and Plan:   1. Asthma, moderate persistent, well-controlled   2. Other allergic rhinitis   3. LPRD (laryngopharyngeal reflux disease)     1. Continue Dexilant 60 one tablet one time a day in morning.     2. Continue Symbicort 160 two inhalations two times per day   3. Can use OTC Nasacort 1-2 sprays each nostril 3-7 times per week  4. Continue Proventil HFA if needed.  5. Return in 6 months or earlier if  problem  Overall [name] appears to be doing relatively well concerning his airway and his reflux on his current plan and he will continue to use this treatment as noted above and I will see him back in his clinic in 6 months or earlier if there is a problem.  Laurette Schimke, MD Allergy / Immunology [facility]

## 2020-09-13 NOTE — Patient Instructions (Signed)
  1. Continue Dexilant 60 one tablet one time a day in morning.     2. Continue Symbicort 160 two inhalations two times per day   3. Can use OTC Nasacort 1-2 sprays each nostril 3-7 times per week  4. Continue Proventil HFA if needed.  5. Return in 6 months or earlier if problem

## 2020-09-14 ENCOUNTER — Encounter: Payer: Self-pay | Admitting: Allergy and Immunology

## 2020-09-20 DIAGNOSIS — E7800 Pure hypercholesterolemia, unspecified: Secondary | ICD-10-CM | POA: Diagnosis not present

## 2020-09-20 DIAGNOSIS — E1165 Type 2 diabetes mellitus with hyperglycemia: Secondary | ICD-10-CM | POA: Diagnosis not present

## 2020-09-20 DIAGNOSIS — I10 Essential (primary) hypertension: Secondary | ICD-10-CM | POA: Diagnosis not present

## 2020-10-21 DIAGNOSIS — E1165 Type 2 diabetes mellitus with hyperglycemia: Secondary | ICD-10-CM | POA: Diagnosis not present

## 2020-10-21 DIAGNOSIS — I10 Essential (primary) hypertension: Secondary | ICD-10-CM | POA: Diagnosis not present

## 2020-10-21 DIAGNOSIS — E7800 Pure hypercholesterolemia, unspecified: Secondary | ICD-10-CM | POA: Diagnosis not present

## 2020-11-02 DIAGNOSIS — Z6841 Body Mass Index (BMI) 40.0 and over, adult: Secondary | ICD-10-CM | POA: Diagnosis not present

## 2020-11-02 DIAGNOSIS — E7800 Pure hypercholesterolemia, unspecified: Secondary | ICD-10-CM | POA: Diagnosis not present

## 2020-11-02 DIAGNOSIS — I10 Essential (primary) hypertension: Secondary | ICD-10-CM | POA: Diagnosis not present

## 2020-11-02 DIAGNOSIS — Z794 Long term (current) use of insulin: Secondary | ICD-10-CM | POA: Diagnosis not present

## 2020-11-02 DIAGNOSIS — E1165 Type 2 diabetes mellitus with hyperglycemia: Secondary | ICD-10-CM | POA: Diagnosis not present

## 2020-11-02 DIAGNOSIS — Z0000 Encounter for general adult medical examination without abnormal findings: Secondary | ICD-10-CM | POA: Diagnosis not present

## 2020-11-21 DIAGNOSIS — E7800 Pure hypercholesterolemia, unspecified: Secondary | ICD-10-CM | POA: Diagnosis not present

## 2020-11-21 DIAGNOSIS — I10 Essential (primary) hypertension: Secondary | ICD-10-CM | POA: Diagnosis not present

## 2020-11-21 DIAGNOSIS — E1165 Type 2 diabetes mellitus with hyperglycemia: Secondary | ICD-10-CM | POA: Diagnosis not present

## 2020-12-27 DIAGNOSIS — J019 Acute sinusitis, unspecified: Secondary | ICD-10-CM | POA: Diagnosis not present

## 2020-12-27 DIAGNOSIS — Z6841 Body Mass Index (BMI) 40.0 and over, adult: Secondary | ICD-10-CM | POA: Diagnosis not present

## 2021-02-21 DIAGNOSIS — E1143 Type 2 diabetes mellitus with diabetic autonomic (poly)neuropathy: Secondary | ICD-10-CM | POA: Diagnosis not present

## 2021-02-21 DIAGNOSIS — I10 Essential (primary) hypertension: Secondary | ICD-10-CM | POA: Diagnosis not present

## 2021-02-21 DIAGNOSIS — E7800 Pure hypercholesterolemia, unspecified: Secondary | ICD-10-CM | POA: Diagnosis not present

## 2021-03-09 DIAGNOSIS — Z20822 Contact with and (suspected) exposure to covid-19: Secondary | ICD-10-CM | POA: Diagnosis not present

## 2021-03-10 ENCOUNTER — Other Ambulatory Visit: Payer: Self-pay

## 2021-03-10 ENCOUNTER — Ambulatory Visit: Payer: Medicare PPO | Admitting: Allergy and Immunology

## 2021-03-10 VITALS — BP 110/76 | HR 72 | Resp 16

## 2021-03-10 DIAGNOSIS — K219 Gastro-esophageal reflux disease without esophagitis: Secondary | ICD-10-CM | POA: Diagnosis not present

## 2021-03-10 DIAGNOSIS — J3089 Other allergic rhinitis: Secondary | ICD-10-CM

## 2021-03-10 DIAGNOSIS — J4541 Moderate persistent asthma with (acute) exacerbation: Secondary | ICD-10-CM

## 2021-03-10 DIAGNOSIS — J988 Other specified respiratory disorders: Secondary | ICD-10-CM

## 2021-03-10 DIAGNOSIS — B9789 Other viral agents as the cause of diseases classified elsewhere: Secondary | ICD-10-CM

## 2021-03-10 NOTE — Patient Instructions (Addendum)
  1. Continue Dexilant 60 one tablet one time a day in morning.     2. Continue Symbicort 160 two inhalations two times per day   3. Can use Nasonex 1-2 sprays each nostril 3-7 times per week  4. Continue Proventil HFA if needed.  5. For this episode:   A. Prednisone 10 mg - 1 tablet 1 time per day for 5-10 days only.   B. Mucinex DM - 1-2 times per day  C. Loratadine 10 mg - 1 tablet 2 times per day  5. Return in 6 months or earlier if problem

## 2021-03-10 NOTE — Progress Notes (Signed)
[place] - [place] - [place] - [place] - [place]   Follow-up Note  Referring Provider: Noni Saupe, MD Primary Provider: Noni Saupe, MD Date of Office Visit: 03/10/2021  Subjective:   Marcus Harding. (DOB: November 20, 1955) is a 65 y.o. male who [facility] 03[facility]10/2021 in re-evaluation of the following:  HPI: Marcus Harding returns to this clinic in reevaluation of asthma, allergic rhinoconjunctivitis, and reflux.  His last visit in this clinic was 13 Sep 2020.  He was really doing very well and had no significant respiratory tract issues while he continues to use anti-inflammatory agents for his airway and therapy directed against reflux.  He did not require systemic steroid or an antibiotic for any type of airway issue.  He rarely uses a short acting bronchodilator.  However, about 12 days ago his wife became sick with sore throat and nasal congestion and runny nose and she fortunately resolved the bulk of this issue over the course of 4 days.  Then, Marcus Harding developed sneezing, coughing, sore throat, runny nose, chills, diarrhea, and some of the stuff that he coughs up may be a little bloody.  He is taking Imodium and over-the-counter cough and cold and some Delsym.  He does not have any anosmia.  He has been using a short acting bronchodilator.  He is on day 5 of this issue.  He did have the flu vaccine 2 weeks ago.  He has never had a COVID-vaccine but was infected with COVID January 2022 treated with a monoclonal antibody infusion.  Allergies as of 03/10/2021       Reactions   Cortisone Itching   Other reaction(s): ITCHING        Medication List    albuterol 108 (90 Base) MCG/ACT inhaler Commonly known as: VENTOLIN HFA Inhale into the lungs.   B-12 1000 MCG Caps Take 1 capsule by mouth 2 (two) times daily.   Basaglar KwikPen 100 UNIT/ML Inject 50 Units into the skin at bedtime.   celecoxib 200 MG capsule Commonly  known as: CELEBREX Take 200 mg by mouth daily.   dexlansoprazole 60 MG capsule Commonly known as: Dexilant Take 1 capsule (60 mg total) by mouth 2 (two) times daily. What changed: when to take this   DULoxetine 60 MG capsule Commonly known as: CYMBALTA Take 60 mg by mouth daily.   glipiZIDE 2.5 mg Tabs tablet Commonly known as: GLUCOTROL Take 2.5 mg by mouth daily before breakfast.   lisinopril 20 MG tablet Commonly known as: ZESTRIL   pravastatin 20 MG tablet Commonly known as: PRAVACHOL daily.   rOPINIRole 1 MG tablet Commonly known as: REQUIP Take 1.5 mg by mouth.   Symbicort 160-4.5 MCG/ACT inhaler Generic drug: budesonide-formoterol Inhale two puffs twice daily to prevent cough or wheeze.  Rinse, gargle, and spit after use.   tamsulosin 0.4 MG Caps capsule Commonly known as: FLOMAX Take 0.4 mg by mouth at bedtime.    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   HENT: Negative.    Eyes: Negative.   Respiratory: Negative.    Cardiovascular: Negative.   Gastrointestinal: Negative.   Genitourinary: Negative.   Musculoskeletal: Negative.   Skin: Negative.   Neurological: Negative.   Endo/Heme/Allergies: Negative.   Psychiatric/Behavioral: Negative.      Objective:   Vitals:   03/10/21 1600  BP: 110/76  Pulse: 72  Resp: 16  SpO2: 96%          Physical  Exam Constitutional:      Appearance: He is not diaphoretic.  HENT:     Head: Normocephalic.     Right Ear: Tympanic membrane, ear canal and external ear normal.     Left Ear: Tympanic membrane, ear canal and external ear normal.     Nose: Nose normal. No mucosal edema or rhinorrhea.     Mouth/Throat:     Pharynx: Uvula midline. No oropharyngeal exudate.  Eyes:     Conjunctiva/sclera: Conjunctivae normal.  Neck:     Thyroid: No thyromegaly.     Trachea: Trachea normal. No tracheal tenderness or tracheal deviation.  Cardiovascular:     Rate and Rhythm: Normal rate and regular rhythm.     Heart sounds: Normal heart sounds, S1 normal and S2 normal. No murmur heard. Pulmonary:     Effort: No respiratory distress.     Breath sounds: Normal breath sounds. No stridor. No wheezing or rales.  Lymphadenopathy:     Head:     Right side of head: No tonsillar adenopathy.     Left side of head: No tonsillar adenopathy.     Cervical: No cervical adenopathy.  Skin:    Findings: No erythema or rash.     Nails: There is no clubbing.  Neurological:     Mental Status: He is alert.    Diagnostics:    Spirometry was performed and demonstrated an FEV1 of 1.91 at 59 % of predicted.  BIANEX NOW COVID SWAB Negative  Assessment and Plan:   1. Asthma, not well controlled, moderate persistent, with acute exacerbation   2. Other allergic rhinitis   3. LPRD (laryngopharyngeal reflux disease)   4. Viral respiratory infection     1. Continue Dexilant 60 one tablet one time a day in morning.     2. Continue Symbicort 160 two inhalations two times per day   3. Can use Nasonex 1-2 sprays each nostril 3-7 times per week  4. Continue Proventil HFA if needed.  5. For this episode:   A. Prednisone 10 mg - 1 tablet 1 time per day for 5-10 days only.   B. Mucinex DM - 1-2 times per day  C. Loratadine 10 mg - 1 tablet 2 times per day  5. Return in 6 months or earlier if problem  It appears that  Marcus Harding has contracted a Non-Covid viral respiratory tract infection and it is giving rise to some inflammation in his airway.  We will give him a very low dose of steroids while he watches his sugars during the use of these steroids in an attempt to get rid of some of the inflammation within his airway.  We will continue on a large collection of anti-inflammatory agents for his airway on  a chronic basis as well as therapy directed against reflux.  For the most part his asthma and allergic rhinitis and LPR were under good control until this event and we will not change any of that therapy.  We will see him back in this clinic in 6 months or earlier if there is a problem.   Laurette Schimke, MD Allergy / Immunology [facility]

## 2021-03-14 ENCOUNTER — Ambulatory Visit: Payer: Medicare PPO | Admitting: Allergy and Immunology

## 2021-03-14 ENCOUNTER — Telehealth: Payer: Self-pay | Admitting: Allergy and Immunology

## 2021-03-14 ENCOUNTER — Encounter: Payer: Self-pay | Admitting: Allergy and Immunology

## 2021-03-14 MED ORDER — AZITHROMYCIN 500 MG PO TABS
ORAL_TABLET | ORAL | 0 refills | Status: DC
Start: 2021-03-14 — End: 2021-09-07

## 2021-03-14 NOTE — Telephone Encounter (Signed)
Patients wife states patient is not feeling any better. He's full of congestion, coughing and has mucous coming up. He's taking the Prednisone, Mucinex and Claritin as directed.

## 2021-03-14 NOTE — Telephone Encounter (Signed)
We can add azithromycin 500 mg - 1 tablet 1 time per day for 3 days only

## 2021-03-14 NOTE — Telephone Encounter (Signed)
Patient's wife, Marcus Harding, informed and ERX sent to [facility] as requested.

## 2021-03-14 NOTE — Telephone Encounter (Signed)
Please advice if anything else can be sent in for patient

## 2021-03-23 DIAGNOSIS — E1143 Type 2 diabetes mellitus with diabetic autonomic (poly)neuropathy: Secondary | ICD-10-CM | POA: Diagnosis not present

## 2021-03-23 DIAGNOSIS — E785 Hyperlipidemia, unspecified: Secondary | ICD-10-CM | POA: Diagnosis not present

## 2021-04-22 DIAGNOSIS — E785 Hyperlipidemia, unspecified: Secondary | ICD-10-CM | POA: Diagnosis not present

## 2021-04-22 DIAGNOSIS — I10 Essential (primary) hypertension: Secondary | ICD-10-CM | POA: Diagnosis not present

## 2021-06-06 DIAGNOSIS — E7800 Pure hypercholesterolemia, unspecified: Secondary | ICD-10-CM | POA: Diagnosis not present

## 2021-06-06 DIAGNOSIS — Z6841 Body Mass Index (BMI) 40.0 and over, adult: Secondary | ICD-10-CM | POA: Diagnosis not present

## 2021-06-06 DIAGNOSIS — J449 Chronic obstructive pulmonary disease, unspecified: Secondary | ICD-10-CM | POA: Diagnosis not present

## 2021-06-06 DIAGNOSIS — E1143 Type 2 diabetes mellitus with diabetic autonomic (poly)neuropathy: Secondary | ICD-10-CM | POA: Diagnosis not present

## 2021-06-09 DIAGNOSIS — G4733 Obstructive sleep apnea (adult) (pediatric): Secondary | ICD-10-CM | POA: Diagnosis not present

## 2021-07-18 DIAGNOSIS — E119 Type 2 diabetes mellitus without complications: Secondary | ICD-10-CM | POA: Diagnosis not present

## 2021-07-18 DIAGNOSIS — Z0100 Encounter for examination of eyes and vision without abnormal findings: Secondary | ICD-10-CM | POA: Diagnosis not present

## 2021-07-18 DIAGNOSIS — H2513 Age-related nuclear cataract, bilateral: Secondary | ICD-10-CM | POA: Diagnosis not present

## 2021-08-15 DIAGNOSIS — Z23 Encounter for immunization: Secondary | ICD-10-CM | POA: Diagnosis not present

## 2021-08-15 DIAGNOSIS — Z9181 History of falling: Secondary | ICD-10-CM | POA: Diagnosis not present

## 2021-08-15 DIAGNOSIS — Z79899 Other long term (current) drug therapy: Secondary | ICD-10-CM | POA: Diagnosis not present

## 2021-08-15 DIAGNOSIS — Z6841 Body Mass Index (BMI) 40.0 and over, adult: Secondary | ICD-10-CM | POA: Diagnosis not present

## 2021-08-15 DIAGNOSIS — Z1331 Encounter for screening for depression: Secondary | ICD-10-CM | POA: Diagnosis not present

## 2021-08-15 DIAGNOSIS — Z125 Encounter for screening for malignant neoplasm of prostate: Secondary | ICD-10-CM | POA: Diagnosis not present

## 2021-08-15 DIAGNOSIS — Z0000 Encounter for general adult medical examination without abnormal findings: Secondary | ICD-10-CM | POA: Diagnosis not present

## 2021-09-07 ENCOUNTER — Encounter: Payer: Self-pay | Admitting: Allergy and Immunology

## 2021-09-07 ENCOUNTER — Ambulatory Visit: Payer: Medicare HMO | Admitting: Allergy and Immunology

## 2021-09-07 VITALS — BP 114/78 | HR 64 | Resp 16

## 2021-09-07 DIAGNOSIS — J3089 Other allergic rhinitis: Secondary | ICD-10-CM | POA: Diagnosis not present

## 2021-09-07 DIAGNOSIS — K219 Gastro-esophageal reflux disease without esophagitis: Secondary | ICD-10-CM | POA: Diagnosis not present

## 2021-09-07 DIAGNOSIS — J4540 Moderate persistent asthma, uncomplicated: Secondary | ICD-10-CM | POA: Diagnosis not present

## 2021-09-07 NOTE — Progress Notes (Signed)
[place] [facility] [place] [facility] [place]   Follow-up Note  Referring Provider: Angelina Sheriff, MD Primary Provider: Angelina Sheriff, MD Date of Office Visit: 09/07/2021  Subjective:   Marcus Harding. (DOB: [birth year]) is a 66 y.o. male who returns to the [facility] on 09/07/2021 in re-evaluation of the following:  HPI: Marcus Harding returns to this clinic in evaluation of asthma, allergic rhinoconjunctivitis, reflux.  His last visit to this clinic was 10 March 2021.  He has not had any significant problems with his airway while consistently using his Symbicort and occasionally some nasal steroid.  He has not required an antibiotic or systemic steroid for an airway issue.  He rarely uses his short acting bronchodilator and can exert himself to the extent that his musculoskeletal issues allow him to do so without any problem.  His reflux has been under excellent control with the use of his proton pump inhibitor.  He informs me that he required emergent hospitalization and therapy for rapidly ascending cellulitis from a splinter that developed in his right foot.  Fortunately that issue responded appropriately to therapy.  Allergies as of 09/07/2021       Reactions   Cortisone Itching   Other reaction(s): ITCHING        Medication List    albuterol 108 (90 Base) MCG/ACT inhaler Commonly known as: VENTOLIN HFA Inhale into the lungs.   B-12 1000 MCG Caps Take 1 capsule by mouth 2 (two) times daily.   Basaglar KwikPen 100 UNIT/ML Inject 50 Units into the skin at bedtime.   celecoxib 200 MG capsule Commonly known as: CELEBREX Take 200 mg by mouth daily.   dexlansoprazole 60 MG capsule Commonly known as: Dexilant Take 1 capsule (60 mg total) by mouth 2 (two) times daily. What changed: when to take this   DULoxetine 60 MG capsule Commonly known as: CYMBALTA Take 60 mg by mouth daily.   lisinopril 20 MG tablet Commonly known  as: ZESTRIL   METFORMIN HCL ER PO Take 1,000 mg by mouth in the morning and at bedtime.   mometasone 50 MCG/ACT nasal spray Commonly known as: NASONEX Place 2 sprays into the nose daily.   pravastatin 40 MG tablet Commonly known as: PRAVACHOL Take 40 mg by mouth daily.   rOPINIRole 1 MG tablet Commonly known as: REQUIP Take 1.5 mg by mouth.   Rybelsus 14 MG Tabs Generic drug: Semaglutide Take by mouth in the morning.   Symbicort 160-4.5 MCG/ACT inhaler Generic drug: budesonide-formoterol Inhale two puffs twice daily to prevent cough or wheeze.  Rinse, gargle, and spit after use.   tamsulosin 0.4 MG Caps capsule Commonly known as: FLOMAX Take 0.4 mg by mouth at bedtime.    Past Medical History:  Diagnosis Date   Abnormal gait    Arthritis    Asthma    BPH (benign prostatic hyperplasia)    Chronic low back pain    COPD (chronic obstructive pulmonary disease) (HCC)    Diabetes (HCC)    GERD (gastroesophageal reflux disease)    HTN (hypertension)    Neck pain    Neuropathy    Peptic ulcer hemorrhagic    RLS (restless legs syndrome)     Past Surgical History:  Procedure Laterality Date   ABCESS DRAINAGE  2022   pt reports he had a "naval pus pocket"   APPENDECTOMY  2009    Review of systems negative except as noted in HPI /  PMHx or noted below:  Review of Systems  Constitutional: Negative.   HENT: Negative.    Eyes: Negative.   Respiratory: Negative.    Cardiovascular: Negative.   Gastrointestinal: Negative.   Genitourinary: Negative.   Musculoskeletal: Negative.   Skin: Negative.   Neurological: Negative.   Endo/Heme/Allergies: Negative.   Psychiatric/Behavioral: Negative.      Objective:   Vitals:   09/07/21 1104  BP: 114/78  Pulse: 64  Resp: 16  SpO2: 96%          Physical Exam Constitutional:      Appearance: He is not diaphoretic.  HENT:     Head: Normocephalic.     Right Ear: Tympanic membrane, ear canal and external ear normal.      Left Ear: Tympanic membrane, ear canal and external ear normal.     Nose: Nose normal. No mucosal edema or rhinorrhea.     Mouth/Throat:     Pharynx: Uvula midline. No oropharyngeal exudate.  Eyes:     Conjunctiva/sclera: Conjunctivae normal.  Neck:     Thyroid: No thyromegaly.     Trachea: Trachea normal. No tracheal tenderness or tracheal deviation.  Cardiovascular:     Rate and Rhythm: Normal rate and regular rhythm.     Heart sounds: Normal heart sounds, S1 normal and S2 normal. No murmur heard. Pulmonary:     Effort: No respiratory distress.     Breath sounds: Normal breath sounds. No stridor. No wheezing or rales.  Lymphadenopathy:     Head:     Right side of head: No tonsillar adenopathy.     Left side of head: No tonsillar adenopathy.     Cervical: No cervical adenopathy.  Skin:    Findings: No erythema or rash.     Nails: There is no clubbing.  Neurological:     Mental Status: He is alert.    Diagnostics:    Spirometry was performed and demonstrated an FEV1 of 2.31 at 72 % of predicted.  Assessment and Plan:   1. Asthma, moderate persistent, well-controlled   2. Other allergic rhinitis   3. LPRD (laryngopharyngeal reflux disease)     1. Continue Dexilant 60 one tablet one time a day in morning.     2. Continue Symbicort 160 two inhalations two times per day   3. Can use Nasonex 1-2 sprays each nostril 3-7 times per week  4. Continue Proventil HFA if needed.  5. Continue Loratadine 10 mg - 1 tablet 1 time per day if needed  6. Return in 12 months or earlier if problem  Marcus Harding is doing very well on his plan of anti-inflammatory agents for both his upper and lower airway and therapy directed against reflux as noted above.  Assuming he continues to do well with this plan I will see him back in this clinic in 1 year or earlier if there is a problem.   Allena Katz, MD Allergy / Immunology [place]

## 2021-09-07 NOTE — Patient Instructions (Addendum)
?  1. Continue Dexilant 60 one tablet one time a day in morning.    ? ?2. Continue Symbicort 160 two inhalations two times per day  ? ?3. Can use Nasonex 1-2 sprays each nostril 3-7 times per week ? ?4. Continue Proventil HFA if needed. ? ?5. Continue Loratadine 10 mg - 1 tablet 1 time per day if needed ? ?6. Return in 12 months or earlier if problem ? ? ? ? ? ? ? ? ?  ? ?  ?  ?

## 2021-09-08 ENCOUNTER — Encounter: Payer: Self-pay | Admitting: Allergy and Immunology

## 2021-11-28 DIAGNOSIS — Z6841 Body Mass Index (BMI) 40.0 and over, adult: Secondary | ICD-10-CM | POA: Diagnosis not present

## 2021-11-28 DIAGNOSIS — E1143 Type 2 diabetes mellitus with diabetic autonomic (poly)neuropathy: Secondary | ICD-10-CM | POA: Diagnosis not present

## 2021-11-28 DIAGNOSIS — Z0000 Encounter for general adult medical examination without abnormal findings: Secondary | ICD-10-CM | POA: Diagnosis not present

## 2021-11-28 DIAGNOSIS — E7800 Pure hypercholesterolemia, unspecified: Secondary | ICD-10-CM | POA: Diagnosis not present

## 2021-12-12 DIAGNOSIS — F329 Major depressive disorder, single episode, unspecified: Secondary | ICD-10-CM | POA: Diagnosis not present

## 2022-01-30 DIAGNOSIS — F331 Major depressive disorder, recurrent, moderate: Secondary | ICD-10-CM | POA: Diagnosis not present

## 2022-03-06 DIAGNOSIS — F331 Major depressive disorder, recurrent, moderate: Secondary | ICD-10-CM | POA: Diagnosis not present

## 2022-04-06 ENCOUNTER — Other Ambulatory Visit: Payer: Self-pay | Admitting: *Deleted

## 2022-04-06 MED ORDER — DEXLANSOPRAZOLE 60 MG PO CPDR: DELAYED_RELEASE_CAPSULE | ORAL | 5 refills | Status: AC

## 2022-04-10 DIAGNOSIS — Z6841 Body Mass Index (BMI) 40.0 and over, adult: Secondary | ICD-10-CM | POA: Diagnosis not present

## 2022-04-10 DIAGNOSIS — E7800 Pure hypercholesterolemia, unspecified: Secondary | ICD-10-CM | POA: Diagnosis not present

## 2022-04-10 DIAGNOSIS — E1143 Type 2 diabetes mellitus with diabetic autonomic (poly)neuropathy: Secondary | ICD-10-CM | POA: Diagnosis not present

## 2022-04-25 DIAGNOSIS — Z6841 Body Mass Index (BMI) 40.0 and over, adult: Secondary | ICD-10-CM | POA: Diagnosis not present

## 2022-04-25 DIAGNOSIS — H6120 Impacted cerumen, unspecified ear: Secondary | ICD-10-CM | POA: Diagnosis not present

## 2022-04-25 DIAGNOSIS — R079 Chest pain, unspecified: Secondary | ICD-10-CM | POA: Diagnosis not present

## 2022-04-26 ENCOUNTER — Telehealth: Payer: Self-pay | Admitting: Allergy and Immunology

## 2022-04-26 NOTE — Telephone Encounter (Signed)
Please advise 

## 2022-04-26 NOTE — Telephone Encounter (Signed)
Wife called and states patient had some of his ear wax removed yesterday but the doctor did not want to touch the hardened/compacted wax. She is wanting to know if Dr. Neldon Harding can recommend what patient should do regarding this issue.

## 2022-04-26 NOTE — Telephone Encounter (Signed)
Patient informed. 

## 2022-05-01 DIAGNOSIS — F4323 Adjustment disorder with mixed anxiety and depressed mood: Secondary | ICD-10-CM | POA: Diagnosis not present

## 2022-05-10 ENCOUNTER — Telehealth: Payer: Self-pay | Admitting: Allergy and Immunology

## 2022-05-10 NOTE — Telephone Encounter (Signed)
Wife states patient is part of the [facility] in [place] and were told if it would be possible to switch patients Symbircort to [place]. Patient is able to get this inhaler free with [facility]. If so, please fax prescription to Heide Guile at [facility] [phone]

## 2022-05-11 MED ORDER — BREZTRI AEROSPHERE 160-9-4.8 MCG/ACT IN AERO: INHALATION_SPRAY | RESPIRATORY_TRACT | 5 refills | Status: AC

## 2022-05-11 NOTE — Telephone Encounter (Signed)
Called and informed [facility] of change in medication. Faxed RX to [facility] as requested.

## 2022-06-05 DIAGNOSIS — F4323 Adjustment disorder with mixed anxiety and depressed mood: Secondary | ICD-10-CM | POA: Diagnosis not present

## 2022-06-08 DIAGNOSIS — G4733 Obstructive sleep apnea (adult) (pediatric): Secondary | ICD-10-CM | POA: Diagnosis not present

## 2022-06-09 DIAGNOSIS — R079 Chest pain, unspecified: Secondary | ICD-10-CM | POA: Diagnosis not present

## 2022-06-09 DIAGNOSIS — R9439 Abnormal result of other cardiovascular function study: Secondary | ICD-10-CM | POA: Diagnosis not present

## 2022-06-09 DIAGNOSIS — Z6841 Body Mass Index (BMI) 40.0 and over, adult: Secondary | ICD-10-CM | POA: Diagnosis not present

## 2022-06-09 LAB — LAB REPORT - SCANNED: EGFR: 60

## 2022-06-10 DIAGNOSIS — I10 Essential (primary) hypertension: Secondary | ICD-10-CM | POA: Diagnosis not present

## 2022-06-10 DIAGNOSIS — R079 Chest pain, unspecified: Secondary | ICD-10-CM | POA: Diagnosis not present

## 2022-06-10 DIAGNOSIS — Z6841 Body Mass Index (BMI) 40.0 and over, adult: Secondary | ICD-10-CM | POA: Diagnosis not present

## 2022-06-10 DIAGNOSIS — R9439 Abnormal result of other cardiovascular function study: Secondary | ICD-10-CM | POA: Diagnosis not present

## 2022-06-10 DIAGNOSIS — E119 Type 2 diabetes mellitus without complications: Secondary | ICD-10-CM | POA: Diagnosis not present

## 2022-06-10 DIAGNOSIS — E785 Hyperlipidemia, unspecified: Secondary | ICD-10-CM | POA: Diagnosis not present

## 2022-06-10 DIAGNOSIS — R001 Bradycardia, unspecified: Secondary | ICD-10-CM | POA: Diagnosis not present

## 2022-06-11 DIAGNOSIS — Z7984 Long term (current) use of oral hypoglycemic drugs: Secondary | ICD-10-CM | POA: Diagnosis not present

## 2022-06-11 DIAGNOSIS — J439 Emphysema, unspecified: Secondary | ICD-10-CM | POA: Diagnosis not present

## 2022-06-11 DIAGNOSIS — Z87891 Personal history of nicotine dependence: Secondary | ICD-10-CM | POA: Diagnosis not present

## 2022-06-11 DIAGNOSIS — Z6841 Body Mass Index (BMI) 40.0 and over, adult: Secondary | ICD-10-CM | POA: Diagnosis not present

## 2022-06-11 DIAGNOSIS — E119 Type 2 diabetes mellitus without complications: Secondary | ICD-10-CM | POA: Diagnosis not present

## 2022-06-11 DIAGNOSIS — Z79899 Other long term (current) drug therapy: Secondary | ICD-10-CM | POA: Diagnosis not present

## 2022-06-11 DIAGNOSIS — J449 Chronic obstructive pulmonary disease, unspecified: Secondary | ICD-10-CM | POA: Diagnosis not present

## 2022-06-11 DIAGNOSIS — I2510 Atherosclerotic heart disease of native coronary artery without angina pectoris: Secondary | ICD-10-CM | POA: Diagnosis not present

## 2022-06-11 DIAGNOSIS — N400 Enlarged prostate without lower urinary tract symptoms: Secondary | ICD-10-CM | POA: Diagnosis not present

## 2022-06-11 DIAGNOSIS — R9439 Abnormal result of other cardiovascular function study: Secondary | ICD-10-CM | POA: Diagnosis not present

## 2022-06-11 DIAGNOSIS — R001 Bradycardia, unspecified: Secondary | ICD-10-CM | POA: Diagnosis not present

## 2022-06-11 DIAGNOSIS — E7800 Pure hypercholesterolemia, unspecified: Secondary | ICD-10-CM | POA: Diagnosis not present

## 2022-06-11 DIAGNOSIS — J45909 Unspecified asthma, uncomplicated: Secondary | ICD-10-CM | POA: Diagnosis not present

## 2022-06-11 DIAGNOSIS — M1990 Unspecified osteoarthritis, unspecified site: Secondary | ICD-10-CM | POA: Diagnosis not present

## 2022-06-11 DIAGNOSIS — R079 Chest pain, unspecified: Secondary | ICD-10-CM | POA: Diagnosis not present

## 2022-06-11 DIAGNOSIS — E1140 Type 2 diabetes mellitus with diabetic neuropathy, unspecified: Secondary | ICD-10-CM | POA: Diagnosis not present

## 2022-06-11 DIAGNOSIS — I10 Essential (primary) hypertension: Secondary | ICD-10-CM | POA: Diagnosis not present

## 2022-06-11 DIAGNOSIS — R9431 Abnormal electrocardiogram [ECG] [EKG]: Secondary | ICD-10-CM | POA: Diagnosis not present

## 2022-06-11 DIAGNOSIS — E785 Hyperlipidemia, unspecified: Secondary | ICD-10-CM | POA: Diagnosis not present

## 2022-06-12 ENCOUNTER — Encounter (HOSPITAL_COMMUNITY): Payer: Self-pay

## 2022-06-12 ENCOUNTER — Encounter (HOSPITAL_COMMUNITY): Admission: EM | Disposition: A | Discharge status: Home / Self Care | Payer: Self-pay | Source: Ambulatory Visit

## 2022-06-12 ENCOUNTER — Observation Stay (HOSPITAL_COMMUNITY)
Admission: EM | Admit: 2022-06-12 | Discharge: 2022-06-12 | Disposition: A | Discharge status: Home / Self Care | Payer: Medicare HMO | Source: Ambulatory Visit | Attending: Cardiovascular Disease | Admitting: Cardiovascular Disease

## 2022-06-12 DIAGNOSIS — I2510 Atherosclerotic heart disease of native coronary artery without angina pectoris: Secondary | ICD-10-CM

## 2022-06-12 DIAGNOSIS — J45909 Unspecified asthma, uncomplicated: Secondary | ICD-10-CM | POA: Diagnosis not present

## 2022-06-12 DIAGNOSIS — Z7984 Long term (current) use of oral hypoglycemic drugs: Secondary | ICD-10-CM | POA: Insufficient documentation

## 2022-06-12 DIAGNOSIS — R079 Chest pain, unspecified: Principal | ICD-10-CM | POA: Insufficient documentation

## 2022-06-12 DIAGNOSIS — E1140 Type 2 diabetes mellitus with diabetic neuropathy, unspecified: Secondary | ICD-10-CM | POA: Insufficient documentation

## 2022-06-12 DIAGNOSIS — I10 Essential (primary) hypertension: Secondary | ICD-10-CM | POA: Diagnosis not present

## 2022-06-12 DIAGNOSIS — Z87891 Personal history of nicotine dependence: Secondary | ICD-10-CM | POA: Diagnosis not present

## 2022-06-12 DIAGNOSIS — R9439 Abnormal result of other cardiovascular function study: Secondary | ICD-10-CM

## 2022-06-12 DIAGNOSIS — J449 Chronic obstructive pulmonary disease, unspecified: Secondary | ICD-10-CM | POA: Insufficient documentation

## 2022-06-12 DIAGNOSIS — Z79899 Other long term (current) drug therapy: Secondary | ICD-10-CM | POA: Diagnosis not present

## 2022-06-12 HISTORY — DX: Atherosclerotic heart disease of native coronary artery without angina pectoris: I25.10

## 2022-06-12 HISTORY — DX: Chest pain, unspecified: R07.9

## 2022-06-12 HISTORY — DX: Abnormal result of other cardiovascular function study: R94.39

## 2022-06-12 HISTORY — PX: LEFT HEART CATH AND CORONARY ANGIOGRAPHY: CATH118249

## 2022-06-12 LAB — GLUCOSE, CAPILLARY: Glucose-Capillary: 129 mg/dL — ABNORMAL HIGH (ref 70–99)

## 2022-06-12 SURGERY — LEFT HEART CATH AND CORONARY ANGIOGRAPHY
Anesthesia: LOCAL

## 2022-06-12 MED ORDER — VERAPAMIL HCL 2.5 MG/ML IV SOLN
INTRAVENOUS | Status: DC | PRN
  Administered 2022-06-12: 10 mL via INTRA_ARTERIAL

## 2022-06-12 MED ORDER — FENTANYL CITRATE (PF) 100 MCG/2ML IJ SOLN
INTRAMUSCULAR | Status: AC
  Filled 2022-06-12: qty 2

## 2022-06-12 MED ORDER — ASPIRIN 81 MG PO TBEC: 81.0000 mg | DELAYED_RELEASE_TABLET | Freq: Every day | ORAL | 3 refills | Status: AC

## 2022-06-12 MED ORDER — HEPARIN SODIUM (PORCINE) 5000 UNIT/ML IJ SOLN: 5000.0000 [IU] | Freq: Three times a day (TID) | INTRAMUSCULAR | Status: DC

## 2022-06-12 MED ORDER — SODIUM CHLORIDE 0.9 % IV SOLN: 250.0000 mL | INTRAVENOUS | Status: DC | PRN

## 2022-06-12 MED ORDER — LIDOCAINE HCL (PF) 1 % IJ SOLN
INTRAMUSCULAR | Status: DC | PRN
  Administered 2022-06-12: 5 mL

## 2022-06-12 MED ORDER — LABETALOL HCL 5 MG/ML IV SOLN: 10.0000 mg | INTRAVENOUS | Status: DC | PRN

## 2022-06-12 MED ORDER — MIDAZOLAM HCL 2 MG/2ML IJ SOLN
INTRAMUSCULAR | Status: DC | PRN
  Administered 2022-06-12: 1 mg via INTRAVENOUS

## 2022-06-12 MED ORDER — MIDAZOLAM HCL 2 MG/2ML IJ SOLN
INTRAMUSCULAR | Status: AC
  Filled 2022-06-12: qty 2

## 2022-06-12 MED ORDER — ONDANSETRON HCL 4 MG/2ML IJ SOLN
4.0000 mg | Freq: Four times a day (QID) | INTRAMUSCULAR | Status: DC | PRN
Start: 2022-06-12 — End: 2022-06-12

## 2022-06-12 MED ORDER — HEPARIN (PORCINE) IN NACL 1000-0.9 UT/500ML-% IV SOLN
INTRAVENOUS | Status: DC | PRN
  Administered 2022-06-12 (×2): 500 mL

## 2022-06-12 MED ORDER — FENTANYL CITRATE (PF) 100 MCG/2ML IJ SOLN
INTRAMUSCULAR | Status: DC | PRN
  Administered 2022-06-12: 25 ug via INTRAVENOUS

## 2022-06-12 MED ORDER — LIDOCAINE HCL (PF) 1 % IJ SOLN
INTRAMUSCULAR | Status: AC
  Filled 2022-06-12: qty 30

## 2022-06-12 MED ORDER — ACETAMINOPHEN 325 MG PO TABS: 650.0000 mg | ORAL_TABLET | ORAL | Status: DC | PRN

## 2022-06-12 MED ORDER — SODIUM CHLORIDE 0.9 % IV SOLN: INTRAVENOUS | Status: AC

## 2022-06-12 MED ORDER — HYDRALAZINE HCL 20 MG/ML IJ SOLN
10.0000 mg | INTRAMUSCULAR | Status: DC | PRN
Start: 2022-06-12 — End: 2022-06-12

## 2022-06-12 MED ORDER — HEPARIN SODIUM (PORCINE) 1000 UNIT/ML IJ SOLN
INTRAMUSCULAR | Status: AC
  Filled 2022-06-12: qty 10

## 2022-06-12 MED ORDER — IOHEXOL 350 MG/ML SOLN
INTRAVENOUS | Status: DC | PRN
  Administered 2022-06-12: 125 mL

## 2022-06-12 MED ORDER — VERAPAMIL HCL 2.5 MG/ML IV SOLN
INTRAVENOUS | Status: AC
  Filled 2022-06-12: qty 2

## 2022-06-12 MED ORDER — SODIUM CHLORIDE 0.9% FLUSH: 3.0000 mL | Freq: Two times a day (BID) | INTRAVENOUS | Status: DC

## 2022-06-12 MED ORDER — HEPARIN SODIUM (PORCINE) 1000 UNIT/ML IJ SOLN
INTRAMUSCULAR | Status: DC | PRN
  Administered 2022-06-12: 7000 [IU] via INTRAVENOUS

## 2022-06-12 MED ORDER — SODIUM CHLORIDE 0.9% FLUSH: 3.0000 mL | INTRAVENOUS | Status: DC | PRN

## 2022-06-12 SURGICAL SUPPLY — 12 items
CATH INFINITI 5 FR 3DRC (CATHETERS) IMPLANT
CATH INFINITI 5 FR AR1 MOD (CATHETERS) IMPLANT
CATH INFINITI 5 FR JL3.5 (CATHETERS) IMPLANT
CATH INFINITI JR4 5F (CATHETERS) IMPLANT
DEVICE RAD COMP TR BAND LRG (VASCULAR PRODUCTS) IMPLANT
GLIDESHEATH SLEND SS 6F .021 (SHEATH) IMPLANT
GUIDEWIRE INQWIRE 1.5J.035X260 (WIRE) IMPLANT
INQWIRE 1.5J .035X260CM (WIRE) ×2
KIT HEART LEFT (KITS) ×1 IMPLANT
PACK CARDIAC CATHETERIZATION (CUSTOM PROCEDURE TRAY) ×1 IMPLANT
TRANSDUCER W/STOPCOCK (MISCELLANEOUS) ×1 IMPLANT
TUBING CIL FLEX 10 FLL-RA (TUBING) ×1 IMPLANT

## 2022-06-12 NOTE — Discharge Summary (Addendum)
Discharge Summary    Patient ID: Marcus Harding. MRN: [id number]; DOB: [birth year]  Admit date: 06/12/2022 Discharge date: 06/12/2022  PCP:  Angelina Sheriff, MD   [facility] Providers Cardiologist:  None   {    Discharge Diagnoses    Principal Problem:   Chest pain Active Problems:   Chest pain of uncertain etiology   Abnormal stress test   CAD (coronary artery disease)    Diagnostic Studies/Procedures    Left heart cath 06/12/22:  Diagnostic Dominance: Right     Prox RCA lesion is 20% stenosed.   Prox Cx to Mid Cx lesion is 30% stenosed.   Mid LAD lesion is 20% stenosed.   Mild non-obstructive disease LVEDP 7 mmHg  Non-cardiac chest pain  Fluoro time: 10.2 (min) DAP: 70162 (mGycm2) Cumulative Air Kerma: 1311 (mGy)   Recommendations: Medical management of mild CAD with ASA and statin. No further cardiac workup. Will d/c home today after two hours of bedrest.      History of Present Illness     Marcus Redge Hartt. is a 67 y.o. male with PMH of obesity, none oxygen dependent COPD, hypertension, hyperlipidemia, large hiatal hernia, GERD, GI bleed from gastric ulcer in 2000, type 2 diabetes, remote tobacco use, who is being seen 06/12/2022 for the evaluation of chest pain.   Marcus Harding with above past medical history is transferred from [facility] to [facility] for further evaluation of chest pain.   Patient states he had ongoing reflux symptoms from large hiatal hernia over the past 3 to 4 years, recently was evaluated by a surgeon who recommended surgical intervention.  He had mentioned ongoing chest pain over the past 49-month  He noted intermittent lower midsternal/epigastric pain over the past 394-month Pain is not triggered or exacerbated by exertion, can occur at rest.  He states he is mostly sedentary with lifestyle due to chronic low back pain.  Pain typically lasts a few minutes and resolve spontaneously.  He noted shortness of  breath with mild dizziness at times with these episodes.  He denied history of MI.  He denied family history of CAD.  He has quit smoking over 40 years ago.  His COPD is mostly stable.  He recalls having anemia and GI bleed from an ulcer in 2000, states it was due to stress from his in-laws.  He also states he has hemorrhoids, has ongoing intermittent bright red blood per rectum, last episode was within the last 2 weeks.  He is currently chest pain-free.  He denies any current shortness of breath, leg edema, syncope, nausea, vomiting, paresthesia, rapid weight gain.   [facility] from 06[facility]09/20[facility] showed WBC 6400, hemoglobin 14.5, platelet count 158k.  INR 1.  Sodium 136, potassium 4.3, bicarb 21, creatinine 0.7, BUN 21, GFR>60.  Glucose elevated 322.  LFTs WNL.  Troponin<0.01 x5. Pro BNP <20.  Flu/RSV/COVID-negative.  EKG revealed sinus bradycardia, no acute ST-T changes.  Chest x-ray from 06/09/2022 revealed large air-fluid level overlying the lower mediastinum consistent with a known history of hernia, with air-fluid level please correlate with any symptom of poor gastric emptying or signs of obstruction.  Stress Myoview 06/10/22: Medium in size, moderate severity reversible defect is noted involving the mid and basal anterior lateral and inferior lateral segments.  Normal LV wall motion, LVEF 58%.  Intermediate risk.  Echocardiogram from 06/10/2022 revealed normal LV size and wall thickness, impaired relaxation of diastolic function, LVEF 55 to  60%.  Normal RV, no significant valvular disease.  RV systolic pressure is mildly elevated, pulmonary artery systolic pressure 35 mmHg. He was transferred to [facility] for further evaluation with cardiac catheterization.    Hospital Course     Consultants: N/A   Chest pain Mild non-obstructive CAD  -Had ongoing atypical chest pain for 73-month-troponin negative x 5, [facility] test [facility], no significant valvular disease, diastolic dysfunction  -He did reports episodes of bright red blood per rectum intermittently, hemoglobin was WNL at [facility] states that he possibly has hemorrhoids, given no active GI bleed or anemia, will proceed with left heart catheterization - LHC today showed mild non-obstructive CAD with 20% stenosis of prox RCA, 30% stenosis prox to mid Cx, 20% stenosis of mid LAD; LVEDP 7 mmHg; chest pain was felt not cardiac related  - post cath care discussed in detail at bedside  - recommend start ASA 836mdaily and continue PTA pravastatin 4085maily; will stop PTA celebrex  - hold metformin 48 hours post cath - message sent today for arrange follow up with cardiology in 2 weeks with Dr RevGeraldo Pitter will discharge home today after bedrest protocol    HTN - trend BP, on PTA lisinopril, will resume 75m45mily   HLD - continue pravastatin 40mg83mly    COPD - not in acute exacerbation, will resume PTA BREZTRI and PRN albuterol    Type 2 diabetes -Hold PTA metformin until 48 hours after cath, resume on 06/15/22  - continue PTA semaglutide and Lantus 50 units  - [facility]    Depression -Resume duloxetine   BPH - resume Flomax       Did the patient have an acute coronary syndrome (MI, NSTEMI, STEMI, etc) this admission?:  No                               Did the patient have a percutaneous coronary intervention (stent / angioplasty)?:  No.        The patient will be scheduled for a TOC follow up appointment in 14  days.  A message has been [facility] the [facility] at the office where the patient should be seen for follow up.  _____________  Discharge Vitals Blood pressure (!) 140/90, pulse 63, resp. rate 18, SpO2 93 %.  There were no vitals filed for this visit.  Labs & Radiologic Studies    CBC No  results for input(s): "WBC", "NEUTROABS", "HGB", "HCT", "MCV", "PLT" in the last 72 hours. Basic Metabolic Panel No results for input(s): "NA", "K", "CL", "CO2", "GLUCOSE", "BUN", "CREATININE", "CALCIUM", "MG", "PHOS" in the last 72 hours. Liver Function Tests No results for input(s): "AST", "ALT", "ALKPHOS", "BILITOT", "PROT", "ALBUMIN" in the last 72 hours. No results for input(s): "LIPASE", "AMYLASE" in the last 72 hours. High Sensitivity Troponin:   No results for input(s): "TROPONINIHS" in the last 720 hours.  BNP Invalid input(s): "POCBNP" D-Dimer No results for input(s): "DDIMER" in the last 72 hours. Hemoglobin A1C No results for input(s): "HGBA1C" in the last 72 hours. Fasting Lipid Panel No results for input(s): "CHOL", "HDL", "LDLCALC", "TRIG", "CHOLHDL", "LDLDIRECT" in the last 72 hours. Thyroid Function Tests No results for input(s): "TSH", "T4TOTAL", "T3FREE", "THYROIDAB" in the last 72  hours.  Invalid input(s): "FREET3" _____________  CARDIAC CATHETERIZATION  Result Date: 06/12/2022   Prox RCA lesion is 20% stenosed.   Prox Cx to Mid Cx lesion is 30% stenosed.   Mid LAD lesion is 20% stenosed. Mild non-obstructive disease LVEDP 7 mmHg Non-cardiac chest pain Recommendations: Medical management of mild CAD with ASA and statin. No further cardiac workup. Will d/c home today after two hours of bedrest.   Disposition   Patient is seen by Dr Angelena Form post -cath and deemed stable for discharge today. Post cath care discussed. Message sent for office staff to arrange follow up appointment. Patient is being discharged home today in good condition.  Follow-up Plans & Appointments     Discharge Instructions     Diet - low sodium heart healthy   Complete by: As directed    Discharge instructions   Complete by: As directed    Please hold metformin for 48 hours after your catheterization, ok to resume on 06/15/22.   Radial Site Care  Refer to this sheet in the next few  weeks. These instructions provide you with information on caring for yourself after your procedure. Your caregiver may also give you more specific instructions. Your treatment has been planned according to current medical practices, but problems sometimes occur. Call your caregiver if you have any problems or questions after your procedure.  HOME CARE INSTRUCTIONS You may shower the day after the procedure. Remove the bandage (dressing) and gently wash the site with plain soap and water. Gently pat the site dry.  Do not apply powder or lotion to the site.  Do not submerge the affected site in water for 3 to 5 days.  Inspect the site at least twice daily.  Do not flex or bend the affected arm for 24 hours.  No lifting over 5 pounds (2.3 kg) for 5 days after your procedure.  Do not drive home if you are discharged the same day of the procedure. Have someone else drive you.  You may drive 24 hours after the procedure unless otherwise instructed by your caregiver.   What to expect: Any bruising will usually fade within 1 to 2 weeks.  Blood that collects in the tissue (hematoma) may be painful to the touch. It should usually decrease in size and tenderness within 1 to 2 weeks.   SEEK IMMEDIATE MEDICAL CARE IF: You have unusual pain at the radial site.  You have redness, warmth, swelling, or pain at the radial site.  You have drainage (other than a small amount of blood on the dressing).  You have chills.  You have a fever or persistent symptoms for more than 72 hours.  You have a fever and your symptoms suddenly get worse.  Your arm becomes pale, cool, tingly, or numb.  You have heavy bleeding from the site. Hold pressure on the site.    PLEASE REMEMBER TO BRING ALL OF YOUR MEDICATIONS TO EACH OF YOUR FOLLOW-UP OFFICE VISITS.  PLEASE ATTEND ALL SCHEDULED FOLLOW-UP APPOINTMENTS.   Activity: Increase activity slowly as tolerated. You may shower, but no soaking baths (or swimming) for 1 week.  No driving for 24 hours. No lifting over 5 lbs for 1 week. No sexual activity for 1 week.   Wound Care: You may wash cath site gently with soap and water. Keep cath site clean and dry. If you notice pain, swelling, bleeding or pus at your cath site, please call [phone].   Increase activity slowly   Complete by:  As directed         Discharge Medications   Allergies as of 06/12/2022       Reactions   Cortisone Itching   Other reaction(s): ITCHING        Medication List     STOP taking these medications    celecoxib 200 MG capsule Commonly known as: CELEBREX       TAKE these medications    albuterol 108 (90 Base) MCG/ACT inhaler Commonly known as: VENTOLIN HFA Inhale into the lungs.   aspirin EC 81 MG tablet Take 1 tablet (81 mg total) by mouth daily. Swallow whole.   B-12 1000 MCG Caps Take 1 capsule by mouth 2 (two) times daily.   Basaglar KwikPen 100 UNIT/ML Inject 50 Units into the skin at bedtime.   Breztri Aerosphere 160-9-4.8 MCG/ACT Aero Generic drug: Budeson-Glycopyrrol-Formoterol Inhale two puffs twice daily to prevent cough or wheeze.  Rinse, gargle, and spit after use.   dexlansoprazole 60 MG capsule Commonly known as: Dexilant Take one tablet once daily as directed   DULoxetine 60 MG capsule Commonly known as: CYMBALTA Take 60 mg by mouth daily.   lisinopril 20 MG tablet Commonly known as: ZESTRIL   METFORMIN HCL ER PO Take 1,000 mg by mouth in the morning and at bedtime.   mometasone 50 MCG/ACT nasal spray Commonly known as: NASONEX Place 2 sprays into the nose daily.   pravastatin 40 MG tablet Commonly known as: PRAVACHOL Take 40 mg by mouth daily.   rOPINIRole 1 MG tablet Commonly known as: REQUIP Take 1.5 mg by mouth.   Rybelsus 14 MG Tabs Generic drug: Semaglutide Take by mouth in the morning.   tamsulosin 0.4 MG Caps capsule Commonly known as: FLOMAX Take 0.4 mg by mouth at bedtime.            Outstanding  Labs/Studies   N/A   Duration of Discharge Encounter   Greater than 30 minutes including physician time.  Signed, Margie Billet, NP 06/12/2022, 4:42 PM   I have personally seen and examined this patient. I agree with the assessment and plan as outlined above.  Pt with mild CAD by cath. Ok to discharge home today. He can proceed with planning for surgical repair of hiatal hernia.   [name], MD, [facility] 06/12/2022 6:02 PM

## 2022-06-12 NOTE — Interval H&P Note (Signed)
History and Physical Interval Note:  06/12/2022 2:48 PM  Marcus Harding.  has presented today for surgery, with the diagnosis of abnormal stress test.  The various methods of treatment have been discussed with the patient and family. After consideration of risks, benefits and other options for treatment, the patient has consented to  Procedure(s): LEFT HEART CATH AND CORONARY ANGIOGRAPHY (N/A) as a surgical intervention.  The patient's history has been reviewed, patient examined, no change in status, stable for surgery.  I have reviewed the patient's chart and labs.  Questions were answered to the patient's satisfaction.    Cath Lab Visit (complete for each Cath Lab visit)  Clinical Evaluation Leading to the Procedure:   ACS: No.  Non-ACS:    Anginal Classification: CCS III  Anti-ischemic medical therapy: No Therapy  Non-Invasive Test Results: High-risk stress test findings: cardiac mortality >3%/year  Prior CABG: No previous CABG        [name]

## 2022-06-12 NOTE — Discharge Instructions (Signed)

## 2022-06-12 NOTE — Progress Notes (Signed)
TR BAND REMOVAL  LOCATION:    right radial  DEFLATED PER PROTOCOL:    Yes.    TIME BAND OFF / DRESSING APPLIED:    1830 gauze dressing applied   SITE UPON ARRIVAL:    Level 0  SITE AFTER BAND REMOVAL:    Level 0  CIRCULATION SENSATION AND MOVEMENT:    Within Normal Limits   Yes.    COMMENTS:   no issues noted

## 2022-06-12 NOTE — H&P (Addendum)
Cardiology Admission History and Physical   Patient ID: Marcus Harding. MRN: [id number]; DOB: June 01, 1955   Admission date: 06/12/2022  PCP:  Angelina Sheriff, MD   [place] Providers Cardiologist:  None   {    Chief Complaint:  chest pain  Patient Profile:   Marcus Harding. is a 67 y.o. male with PMH of obesity, none oxygen dependent COPD, hypertension, hyperlipidemia, large hiatal hernia, GERD, GI bleed from gastric ulcer in 2000, type 2 diabetes, remote tobacco use, who is being seen 06/12/2022 for the evaluation of chest pain.  History of Present Illness:   Marcus Harding with above past medical history is transferred from [facility] to [facility] for further evaluation of chest pain.   Patient states he had ongoing reflux symptoms from large hiatal hernia over the past 3 to 4 years, recently was evaluated by a surgeon who recommended surgical intervention.  He had mentioned ongoing chest pain over the past 67-month  He noted intermittent lower midsternal/epigastric pain over the past 311-month Pain is not triggered or exacerbated by exertion, can occur at rest.  He states he is mostly sedentary with lifestyle due to chronic low back pain.  Pain typically lasts a few minutes and resolve spontaneously.  He noted shortness of breath with mild dizziness at times with these episodes.  He denied history of MI.  He denied family history of CAD.  He has quit smoking over 40 years ago.  His COPD is mostly stable.  He recalls having anemia and GI bleed from an ulcer in 2000, states it was due to stress from his in-laws.  He also states he has hemorrhoids, has ongoing intermittent bright red blood per rectum, last episode was within the last 2 weeks.  He is currently chest pain-free.  He denies any current shortness of breath, leg edema, syncope, nausea, vomiting, paresthesia, rapid weight gain.  [facility] showed WBC 6400,  hemoglobin 14.5, platelet count 158k.  INR 1.  Sodium 136, potassium 4.3, bicarb 21, creatinine 0.7, BUN 21, GFR>60.  Glucose elevated 322.  LFTs WNL.  Troponin<0.01 x5. Pro BNP <20.  Flu/RSV/COVID-negative.  EKG revealed sinus bradycardia, no acute ST-T changes.  Chest x-ray from 06/09/2022 revealed large air-fluid level overlying the lower mediastinum consistent with a known history of hernia, with air-fluid level please correlate with any symptom of poor gastric emptying or signs of obstruction.  Stress Myoview 06/10/22: Medium in size, moderate severity reversible defect is noted involving the mid and basal anterior lateral and inferior lateral segments.  Normal LV wall motion, LVEF 58%.  Intermediate risk.  Echocardiogram from 06/10/2022 revealed normal LV size and wall thickness, impaired relaxation of diastolic function, LVEF 55 to 60%.  Normal RV, no significant valvular disease.  RV systolic pressure is mildly elevated, pulmonary artery systolic pressure 35 mmHg. [facility] with [facility].    Past Medical History:  Diagnosis Date   Abnormal gait    Arthritis    Asthma    BPH (benign prostatic hyperplasia)    Chronic low back pain    COPD (chronic obstructive pulmonary disease) (HCC)    Diabetes (HCC)    GERD (gastroesophageal reflux disease)    HTN (hypertension)    Neck pain    Neuropathy    Peptic ulcer hemorrhagic    RLS (restless legs syndrome)     Past Surgical History:  Procedure  Laterality Date   ABCESS DRAINAGE  2022   pt reports he had a "naval pus pocket"   APPENDECTOMY  2009     Medications Prior to Admission: Prior to Admission medications   Medication Sig Start Date End Date Taking? Authorizing Provider  albuterol (VENTOLIN HFA) 108 (90 Base) MCG/ACT inhaler Inhale into the lungs.    [provider]  Budeson-Glycopyrrol-Formoterol (BREZTRI AEROSPHERE) 160-9-4.8 MCG/ACT AERO Inhale two puffs twice  daily to prevent cough or wheeze.  Rinse, gargle, and spit after use. 05/11/22   Kozlow, Donnamarie Poag, MD  celecoxib (CELEBREX) 200 MG capsule Take 200 mg by mouth daily.    [provider]  Cyanocobalamin (B-12) 1000 MCG CAPS Take 1 capsule by mouth 2 (two) times daily.    [provider]  dexlansoprazole (DEXILANT) 60 MG capsule Take one tablet once daily as directed 04/06/22   Kozlow, Donnamarie Poag, MD  DULoxetine (CYMBALTA) 60 MG capsule Take 60 mg by mouth daily.  07/03/19   [provider]  Insulin Glargine (BASAGLAR KWIKPEN) 100 UNIT/ML Inject 50 Units into the skin at bedtime.    [provider]  lisinopril (PRINIVIL,ZESTRIL) 20 MG tablet  01/30/18   [provider]  METFORMIN HCL ER PO Take 1,000 mg by mouth in the morning and at bedtime.    [provider]  mometasone (NASONEX) 50 MCG/ACT nasal spray Place 2 sprays into the nose daily.    [provider]  pravastatin (PRAVACHOL) 40 MG tablet Take 40 mg by mouth daily.    [provider]  rOPINIRole (REQUIP) 1 MG tablet Take 1.5 mg by mouth.     [provider]  Semaglutide (RYBELSUS) 14 MG TABS Take by mouth in the morning.    [provider]  tamsulosin (FLOMAX) 0.4 MG CAPS capsule Take 0.4 mg by mouth at bedtime. 01/06/15   [provider]     Allergies:    Allergies  Allergen Reactions   Cortisone Itching    Other reaction(s): ITCHING    Social History:   Social History   Socioeconomic History   Marital status: Married    Spouse name: Not on file   Number of children: 0   Years of education: 12   Highest education level: High school graduate  Occupational History   Occupation: disabled  Tobacco Use   Smoking status: Former    Packs/day: 5.00    Years: 7.00    Total pack years: 35.00    Types: Cigars, Cigarettes    Quit date: 04/25/1979    Years since quitting: 43.1   Smokeless tobacco: Former    Types: Snuff    Quit date: 03/18/2005   Vaping Use   Vaping Use: Never used  Substance and Sexual Activity   Alcohol use: Yes    Alcohol/week: 0.0 standard drinks of alcohol    Comment: rare - very small amt   Drug use: No   Sexual activity: Not on file  Other Topics Concern   Not on file  Social History Narrative   Lives at home with wife.   Left-handed.   1 cup coffee, 1-2 soft drinks per day.   Social Determinants of Health   Financial Resource Strain: Not on file  Food Insecurity: Not on file  Transportation Needs: Not on file  Physical Activity: Not on file  Stress: Not on file  Social Connections: Not on file  Intimate Partner Violence: Not on file    Family History:  The patient's family history includes Breast cancer in his paternal grandmother; Diabetes in his maternal grandmother; Hypertension in his brother and mother; Suicidality in his father.    ROS:  Constitutional: Denied fever, chills, malaise, night sweats Eyes: Denied vision change or loss Ears/Nose/Mouth/Throat: Denied ear ache, sore throat, coughing, sinus pain Cardiovascular: See HPI Respiratory: See HPI Gastrointestinal:see HPI Genital/Urinary: Denied dysuria, hematuria, urinary frequency/urgency Musculoskeletal: Denied muscle ache, joint pain, weakness Skin: Denied rash, wound Neuro: Denied headache, dizziness, syncope Psych: Denied history of depression/anxiety  Endocrine: history of diabetes   Physical Exam/Data:  There were no vitals filed for this visit. No intake or output data in the 24 hours ending 06/12/22 1432    09/13/2020   11:05 AM 09/10/2019   10:57 AM 06/04/2018    9:54 AM  Last 3 Weights  Weight (lbs) 295 lb 326 lb 6.4 oz 338 lb  Weight (kg) 133.811 kg 148.054 kg 153.316 kg     There is no height or weight on file to calculate BMI.   Vitals: There were no vitals filed for this visit.   General Appearance: In no apparent distress, laying in bed HEENT: Normocephalic, atraumatic.   Neck: Supple, trachea  midline, no JVDs Cardiovascular: Regular rate and rhythm, normal S1-S2,  no murmur Respiratory: Resting breathing unlabored, lungs sounds clear to auscultation bilaterally, no use of accessory muscles. On room air.  No wheezes, rales or rhonchi.   Gastrointestinal: Bowel sounds positive, abdomen soft, obese  Extremities: Able to move all extremities in bed without difficulty, no edema of BLE Musculoskeletal: Normal muscle bulk and tone Skin: Intact, warm, dry. No rashes  Neurologic: Alert, oriented to person, place and time. Fluent speech, no cognitive deficit, no gross focal neuro deficit Psychiatric: Normal affect. Mood is appropriate.     EKG:  The ECG showed sinus bradycardia high 50s at [place]   Relevant CV Studies:  Echocardiogram 06/10/2022: LVEF 55 to 123456, LV diastolic function has impaired relaxation pattern, GLS -12.4%  Stress Myoview from 06/10/2022:  Medium in size, moderate severity reversible defect is noted involving the mid and basal anterolateral and inferior lateral segment, normal LV wall motion, LVEF 58%, intermediate risk.  Laboratory Data:  High Sensitivity Troponin:  No results for input(s): "TROPONINIHS" in the last 720 hours.    ChemistryNo results for input(s): "NA", "K", "CL", "CO2", "GLUCOSE", "BUN", "CREATININE", "CALCIUM", "MG", "GFRNONAA", "GFRAA", "ANIONGAP" in the last 168 hours.  No results for input(s): "PROT", "ALBUMIN", "AST", "ALT", "ALKPHOS", "BILITOT" in the last 168 hours. Lipids No results for input(s): "CHOL", "TRIG", "HDL", "LABVLDL", "LDLCALC", "CHOLHDL" in the last 168 hours. HematologyNo results for input(s): "WBC", "RBC", "HGB", "HCT", "MCV", "MCH", "MCHC", "RDW", "PLT" in the last 168 hours. Thyroid No results for input(s): "TSH", "FREET4" in the last 168 hours. BNPNo results for input(s): "BNP", "PROBNP" in the last 168 hours.  DDimer No results for input(s): "DDIMER" in the last 168 hours.   Radiology/Studies:  No results  found.   Assessment and Plan:   Chest pain Abnormal stress test -Had ongoing atypical chest pain for 41-month-troponin negative x 5, EKG without acute change at [facility]  [facility] to [facility] -Echocardiogram LVEF preserved -Reports episodes of bright red blood per rectum intermittently, [facility] states that he possibly has hemorrhoids, given no active GI bleed or anemia, will proceed with left heart catheterization -Admit to telemetry unit  HTN - trend BP, on PTA  lisinopril, will resume  COPD - not in acute exacerbation, resume BREZTRI and PRN albuterol   Type 2 diabetes -Check hemoglobin A1c -Hold PTA metformin and semaglutide night, resume Lantus 50 units nightly tomorrow - [facility] diet   Depression -Resume duloxetine  BPH - resume Flomax    Risk Assessment/Risk Scores:     Severity of Illness: The appropriate patient status for this patient is OBSERVATION. Observation status is judged to be reasonable and necessary in order to provide the required intensity of service to ensure the patient's safety. The patient's presenting symptoms, physical exam findings, and initial radiographic and laboratory data in the context of their medical condition is felt to place them at decreased risk for further clinical deterioration. Furthermore, it is anticipated that the patient will be medically stable for discharge from the hospital within 2 midnights of admission.    For questions or updates, please contact [place] Please consult [email or web address] for contact info under     Signed, Margie Billet, NP  06/12/2022 2:32 PM    I have personally seen and examined this patient. I agree with the assessment and plan as outlined above.  Pt with chest pain for months. Negative troponin. Echo with normal LV function. Abnormal stress test. Known large hiatal hernia. Pt transferred to [facility]  for cardiac cath.   [name], MD, [facility] 06/12/2022 2:47 PM

## 2022-06-13 ENCOUNTER — Encounter (HOSPITAL_COMMUNITY): Payer: Self-pay | Admitting: Cardiovascular Disease

## 2022-06-16 DIAGNOSIS — K449 Diaphragmatic hernia without obstruction or gangrene: Secondary | ICD-10-CM | POA: Diagnosis not present

## 2022-06-21 ENCOUNTER — Ambulatory Visit: Payer: Medicare HMO | Admitting: Allergy and Immunology

## 2022-06-21 ENCOUNTER — Encounter: Payer: Self-pay | Admitting: Allergy and Immunology

## 2022-06-21 VITALS — BP 114/82 | HR 64 | Resp 16

## 2022-06-21 DIAGNOSIS — J3089 Other allergic rhinitis: Secondary | ICD-10-CM | POA: Diagnosis not present

## 2022-06-21 DIAGNOSIS — R0789 Other chest pain: Secondary | ICD-10-CM | POA: Diagnosis not present

## 2022-06-21 DIAGNOSIS — K219 Gastro-esophageal reflux disease without esophagitis: Secondary | ICD-10-CM

## 2022-06-21 DIAGNOSIS — J4540 Moderate persistent asthma, uncomplicated: Secondary | ICD-10-CM

## 2022-06-21 MED ORDER — FAMOTIDINE 40 MG PO TABS: 40.0000 mg | ORAL_TABLET | Freq: Every day | ORAL | 1 refills | Status: DC

## 2022-06-21 NOTE — Progress Notes (Signed)
[place] - [place] - [place] - [place] - [place]   Follow-up Note  Referring Provider: Noni Saupe, MD Primary Provider: Noni Saupe, MD Date of Office Visit: 06/21/2022  Subjective:   Marcus Harding. (DOB: [birth year]) is a 67 y.o. male who [facility] in re-evaluation of the following:  HPI: Marcus Harding returns to this clinic in evaluation of asthma, allergic rhinoconjunctivitis, reflux.  I last saw him in this clinic 07 Sep 2021.  His most recent event revolves around the fact that he had sternal chest pain and has been having this for the past 3 months.  He had a cardiac catheterization 12 June 2022 which identified no coronary artery disease.  He still continues to have this midsternal chest pain.  He also has very significant dyspnea on exertion.  He cannot really exert himself to any degree and gets completely out of breath.  He does not really have any coughing or wheezing.  He is using a triple inhaler at this point in time although his inhalation technique is not particularly good.    He does have reflux disease but he thinks that his classic reflux symptoms and symptoms tied up with LPR are okay while using Dexilant every day.  He has received the flu vaccine and the RSV vaccine.  Allergies as of 06/21/2022       Reactions   Cortisone Itching   Other reaction(s): ITCHING        Medication List    albuterol 108 (90 Base) MCG/ACT inhaler Commonly known as: VENTOLIN HFA Inhale into the lungs.   aspirin EC 81 MG tablet Take 1 tablet (81 mg total) by mouth daily. Swallow whole.   B-12 1000 MCG Caps Take 1 capsule by mouth 2 (two) times daily.   Basaglar KwikPen 100 UNIT/ML Inject 50 Units into the skin at bedtime.   Breztri Aerosphere 160-9-4.8 MCG/ACT Aero Generic drug: Budeson-Glycopyrrol-Formoterol Inhale two puffs twice daily to prevent cough or wheeze.  Rinse, gargle, and spit after  use.   buPROPion 150 MG 24 hr tablet Commonly known as: WELLBUTRIN XL Take by mouth.   dexlansoprazole 60 MG capsule Commonly known as: Dexilant Take one tablet once daily as directed   DULoxetine 60 MG capsule Commonly known as: CYMBALTA Take 60 mg by mouth daily.   lisinopril 40 MG tablet Commonly known as: ZESTRIL Take 1 tablet by mouth daily.   metFORMIN 500 MG 24 hr tablet Commonly known as: GLUCOPHAGE-XR Take 1,000 mg by mouth 2 (two) times daily.   pravastatin 40 MG tablet Commonly known as: PRAVACHOL Take 40 mg by mouth daily.   rOPINIRole 1 MG tablet Commonly known as: REQUIP Take 1.5 mg by mouth.   tamsulosin 0.4 MG Caps capsule Commonly known as: FLOMAX Take 0.4 mg by mouth at bedtime.     Past Medical History:  Diagnosis Date   Abnormal gait    Arthritis    Asthma    BPH (benign prostatic hyperplasia)    Chronic low back pain    COPD (chronic obstructive pulmonary disease) (HCC)    Diabetes (HCC)    GERD (gastroesophageal reflux disease)    HTN (hypertension)    Neck pain    Neuropathy    Peptic ulcer hemorrhagic    RLS (restless legs syndrome)     Past Surgical History:  Procedure Laterality Date   ABCESS DRAINAGE  2022   pt reports he had a Ship broker  pus pocket"   APPENDECTOMY  2009   LEFT HEART CATH AND CORONARY ANGIOGRAPHY N/A 06/12/2022   Procedure: LEFT HEART CATH AND CORONARY ANGIOGRAPHY;  Surgeon: Kathleene Hazel, MD;  Location: [facility];  Service: Cardiovascular;  Laterality: N/A;    Review of systems negative except as noted in HPI / PMHx or noted below:  Review of Systems  Constitutional: Negative.   HENT: Negative.    Eyes: Negative.   Respiratory: Negative.    Cardiovascular: Negative.   Gastrointestinal: Negative.   Genitourinary: Negative.   Musculoskeletal: Negative.   Skin: Negative.   Neurological: Negative.   Endo/Heme/Allergies: Negative.   Psychiatric/Behavioral: Negative.       Objective:    Vitals:   06/21/22 1110  BP: 114/82  Pulse: 64  Resp: 16  SpO2: 98%          Physical Exam Constitutional:      Appearance: He is not diaphoretic.  HENT:     Head: Normocephalic.     Right Ear: External ear normal. There is impacted cerumen.     Left Ear: Tympanic membrane, ear canal and external ear normal.     Nose: Nose normal. No mucosal edema or rhinorrhea.     Mouth/Throat:     Pharynx: Uvula midline. No oropharyngeal exudate.  Eyes:     Conjunctiva/sclera: Conjunctivae normal.  Neck:     Thyroid: No thyromegaly.     Trachea: Trachea normal. No tracheal tenderness or tracheal deviation.  Cardiovascular:     Rate and Rhythm: Normal rate and regular rhythm.     Heart sounds: Normal heart sounds, S1 normal and S2 normal. No murmur heard. Pulmonary:     Effort: No respiratory distress.     Breath sounds: Normal breath sounds. No stridor. No wheezing or rales.  Lymphadenopathy:     Head:     Right side of head: No tonsillar adenopathy.     Left side of head: No tonsillar adenopathy.     Cervical: No cervical adenopathy.  Skin:    Findings: No erythema or rash.     Nails: There is no clubbing.  Neurological:     Mental Status: He is alert.     Diagnostics:    Spirometry was performed and demonstrated an FEV1 of 2.70 at 84 % of predicted.  Results of the myocardial imaging scan obtained 10 June 2022 identifies left ventricular ejection fraction 58%  Results of a chest x-ray obtained 09 June 2022 identified a large air-fluid level overlying the lower mediastinum consistent with hernia, underinflation with bronchovascular crowding, no pneumothorax, effusion, normal cardiac silhouette  Assessment and Plan:   1. Not well controlled moderate persistent asthma   2. Other allergic rhinitis   3. LPRD (laryngopharyngeal reflux disease)   4. Mid sternal chest pain    1. Continue Dexilant 60 one tablet one time a day in morning.    2. Start famotidine 40  mg - 1 tablet in evening   3. Continue Breztri - two inhalations two times per day (empty lungs)  3. Can use Nasonex 1-2 sprays each nostril 3-7 times per week  4. If needed:  A.  Proventil HFA if needed. B.  Loratadine 10 mg - 1 tablet 1 time per day   5. Return in 4 weeks or earlier if problem  [place] has persistent sternal chest pain and will assume that maybe this is secondary to his reflux disease and add in famotidine to his Dexilant.  If  he still continues to have this pain, which does not appear to be a result of coronary artery disease based on his catheter report, then he may have pericarditis.  We will see what happens over the course of the next several weeks.  And, we educated him on how to use his inhaler with a better technique which will hopefully give rise to better deposition of his anti-inflammatory medications for his airway.  I do not think we need to do anything else at this point in time other than to have him return to this clinic in 4 weeks to assess his response to this approach.   Laurette Schimke, MD Allergy / Immunology [facility]

## 2022-06-21 NOTE — Patient Instructions (Addendum)
  1. Continue Dexilant 60 one tablet one time a day in morning.    2. Start famotidine 40 mg - 1 tablet in evening   3. Continue Breztri - two inhalations two times per day (empty lungs)  3. Can use Nasonex 1-2 sprays each nostril 3-7 times per week  4. If needed:  A.  Proventil HFA if needed. B.  Loratadine 10 mg - 1 tablet 1 time per day   5. Return in 4 weeks or earlier if problem

## 2022-06-22 ENCOUNTER — Encounter: Payer: Self-pay | Admitting: Allergy and Immunology

## 2022-06-27 ENCOUNTER — Ambulatory Visit: Payer: Medicare HMO | Admitting: Cardiology

## 2022-07-12 ENCOUNTER — Encounter: Payer: Self-pay | Admitting: Allergy and Immunology

## 2022-07-12 DIAGNOSIS — G4733 Obstructive sleep apnea (adult) (pediatric): Secondary | ICD-10-CM | POA: Diagnosis not present

## 2022-07-18 DIAGNOSIS — F331 Major depressive disorder, recurrent, moderate: Secondary | ICD-10-CM | POA: Diagnosis not present

## 2022-07-19 ENCOUNTER — Encounter: Payer: Self-pay | Admitting: Allergy and Immunology

## 2022-07-19 ENCOUNTER — Ambulatory Visit: Payer: Medicare HMO | Admitting: Allergy and Immunology

## 2022-07-19 VITALS — BP 148/82 | HR 68 | Resp 18

## 2022-07-19 DIAGNOSIS — J4540 Moderate persistent asthma, uncomplicated: Secondary | ICD-10-CM

## 2022-07-19 DIAGNOSIS — J3089 Other allergic rhinitis: Secondary | ICD-10-CM

## 2022-07-19 DIAGNOSIS — K219 Gastro-esophageal reflux disease without esophagitis: Secondary | ICD-10-CM

## 2022-07-19 DIAGNOSIS — R0789 Other chest pain: Secondary | ICD-10-CM

## 2022-07-19 DIAGNOSIS — I319 Disease of pericardium, unspecified: Secondary | ICD-10-CM

## 2022-07-19 NOTE — Progress Notes (Unsigned)
Marcus Harding returns to this clinic in evaluation of his chest pain syndrome.  I last saw him in this clinic 07 Sep 2021 at which point in time we addressed his reflux disease more aggressively by introducing a H2 receptor blocker to his Dexilant.  This has done nothing for his chest pain.  We will now proceed for evaluation of possible chronic pericarditis.  We will obtain an cardiac MRI and once I have the results of that study we will discuss further evaluation and treatment regarding his chronic chest pain.

## 2022-07-20 ENCOUNTER — Encounter: Payer: Self-pay | Admitting: Allergy and Immunology

## 2022-07-21 DIAGNOSIS — H2513 Age-related nuclear cataract, bilateral: Secondary | ICD-10-CM | POA: Diagnosis not present

## 2022-07-21 DIAGNOSIS — E119 Type 2 diabetes mellitus without complications: Secondary | ICD-10-CM | POA: Diagnosis not present

## 2022-07-26 ENCOUNTER — Other Ambulatory Visit: Payer: Self-pay

## 2022-07-26 DIAGNOSIS — I319 Disease of pericardium, unspecified: Secondary | ICD-10-CM

## 2022-07-26 NOTE — Addendum Note (Signed)
Addended by: Zandra Abts on: 07/26/2022 05:27 PM   Modules accepted: Orders

## 2022-07-27 DIAGNOSIS — G4733 Obstructive sleep apnea (adult) (pediatric): Secondary | ICD-10-CM | POA: Diagnosis not present

## 2022-07-27 DIAGNOSIS — J309 Allergic rhinitis, unspecified: Secondary | ICD-10-CM | POA: Diagnosis not present

## 2022-07-27 DIAGNOSIS — G2581 Restless legs syndrome: Secondary | ICD-10-CM | POA: Diagnosis not present

## 2022-08-08 ENCOUNTER — Telehealth: Payer: Self-pay | Admitting: Allergy and Immunology

## 2022-08-08 NOTE — Telephone Encounter (Signed)
Wife called and was wondering if we were able to call the place where he is getting his cardiac MRI and see if they would be able to see him sooner than 6/14.

## 2022-08-10 DIAGNOSIS — M9903 Segmental and somatic dysfunction of lumbar region: Secondary | ICD-10-CM | POA: Diagnosis not present

## 2022-08-10 DIAGNOSIS — M9905 Segmental and somatic dysfunction of pelvic region: Secondary | ICD-10-CM | POA: Diagnosis not present

## 2022-08-10 DIAGNOSIS — M9904 Segmental and somatic dysfunction of sacral region: Secondary | ICD-10-CM | POA: Diagnosis not present

## 2022-08-10 NOTE — Telephone Encounter (Signed)
Talked with schedulers at [facility].  Patient now scheduled for May 15th at [facility]. [employer] # [id number], confirmation dates Sep 06 2022 - Oct 06 2022.

## 2022-08-11 DIAGNOSIS — M9905 Segmental and somatic dysfunction of pelvic region: Secondary | ICD-10-CM | POA: Diagnosis not present

## 2022-08-11 DIAGNOSIS — M9903 Segmental and somatic dysfunction of lumbar region: Secondary | ICD-10-CM | POA: Diagnosis not present

## 2022-08-11 DIAGNOSIS — M5116 Intervertebral disc disorders with radiculopathy, lumbar region: Secondary | ICD-10-CM | POA: Diagnosis not present

## 2022-08-11 DIAGNOSIS — M9904 Segmental and somatic dysfunction of sacral region: Secondary | ICD-10-CM | POA: Diagnosis not present

## 2022-08-14 DIAGNOSIS — M9905 Segmental and somatic dysfunction of pelvic region: Secondary | ICD-10-CM | POA: Diagnosis not present

## 2022-08-14 DIAGNOSIS — M9904 Segmental and somatic dysfunction of sacral region: Secondary | ICD-10-CM | POA: Diagnosis not present

## 2022-08-14 DIAGNOSIS — M5116 Intervertebral disc disorders with radiculopathy, lumbar region: Secondary | ICD-10-CM | POA: Diagnosis not present

## 2022-08-14 DIAGNOSIS — M9903 Segmental and somatic dysfunction of lumbar region: Secondary | ICD-10-CM | POA: Diagnosis not present

## 2022-08-16 DIAGNOSIS — M9903 Segmental and somatic dysfunction of lumbar region: Secondary | ICD-10-CM | POA: Diagnosis not present

## 2022-08-16 DIAGNOSIS — M9904 Segmental and somatic dysfunction of sacral region: Secondary | ICD-10-CM | POA: Diagnosis not present

## 2022-08-16 DIAGNOSIS — M9905 Segmental and somatic dysfunction of pelvic region: Secondary | ICD-10-CM | POA: Diagnosis not present

## 2022-08-16 DIAGNOSIS — M5116 Intervertebral disc disorders with radiculopathy, lumbar region: Secondary | ICD-10-CM | POA: Diagnosis not present

## 2022-09-04 ENCOUNTER — Telehealth (HOSPITAL_COMMUNITY): Payer: Self-pay | Admitting: *Deleted

## 2022-09-04 NOTE — Telephone Encounter (Signed)
Reaching out to patient to offer assistance regarding upcoming cardiac imaging study; pt verbalizes understanding of appt date/time, parking situation and where to check in, and verified current allergies; name and call back number provided for further questions should they arise ? ?Alexarae Oliva RN Navigator Cardiac Imaging ?[facility] ?[phone] office ?[phone] cell ? ?Patient denies metal or claustrophobia. ?

## 2022-09-05 DIAGNOSIS — E118 Type 2 diabetes mellitus with unspecified complications: Secondary | ICD-10-CM | POA: Diagnosis not present

## 2022-09-05 DIAGNOSIS — Z6841 Body Mass Index (BMI) 40.0 and over, adult: Secondary | ICD-10-CM | POA: Diagnosis not present

## 2022-09-05 DIAGNOSIS — F331 Major depressive disorder, recurrent, moderate: Secondary | ICD-10-CM | POA: Diagnosis not present

## 2022-09-05 DIAGNOSIS — I10 Essential (primary) hypertension: Secondary | ICD-10-CM | POA: Diagnosis not present

## 2022-09-05 NOTE — Telephone Encounter (Signed)
Wife called and states patient went to Urgent Care due to his blood pressure going up. Urgent Care wanted to inform Dr. Lucie Leather of his BP readings for today due to his Heart MRI being tomorrow.   Sitting (L Arm) - 162/103  Pulse- 77 Standing (R Arm) - 169/116   Pulse- 88

## 2022-09-06 ENCOUNTER — Ambulatory Visit
Admission: RE | Admit: 2022-09-06 | Discharge: 2022-09-06 | Disposition: A | Discharge status: Home / Self Care | Payer: Medicare HMO | Source: Ambulatory Visit | Attending: Allergy and Immunology | Admitting: Allergy and Immunology

## 2022-09-06 ENCOUNTER — Other Ambulatory Visit: Payer: Self-pay | Admitting: Allergy and Immunology

## 2022-09-06 DIAGNOSIS — I319 Disease of pericardium, unspecified: Secondary | ICD-10-CM | POA: Insufficient documentation

## 2022-09-06 DIAGNOSIS — I3139 Other pericardial effusion (noninflammatory): Secondary | ICD-10-CM

## 2022-09-06 MED ORDER — GADOBUTROL 1 MMOL/ML IV SOLN
16.0000 mL | Freq: Once | INTRAVENOUS | Status: AC | PRN
  Administered 2022-09-06: 16 mL via INTRAVENOUS

## 2022-09-07 ENCOUNTER — Ambulatory Visit: Payer: Medicare HMO | Admitting: Allergy and Immunology

## 2022-09-12 ENCOUNTER — Other Ambulatory Visit: Payer: Self-pay | Admitting: *Deleted

## 2022-09-12 DIAGNOSIS — R079 Chest pain, unspecified: Secondary | ICD-10-CM

## 2022-09-12 DIAGNOSIS — I514 Myocarditis, unspecified: Secondary | ICD-10-CM

## 2022-09-14 DIAGNOSIS — I514 Myocarditis, unspecified: Secondary | ICD-10-CM | POA: Diagnosis not present

## 2022-09-14 DIAGNOSIS — R079 Chest pain, unspecified: Secondary | ICD-10-CM | POA: Diagnosis not present

## 2022-09-15 ENCOUNTER — Telehealth: Payer: Self-pay | Admitting: *Deleted

## 2022-09-15 LAB — CBC WITH DIFFERENTIAL/PLATELET
EOS (ABSOLUTE): 0.1 10*3/uL (ref 0.0–0.4)
Hemoglobin: 14.9 g/dL (ref 13.0–17.7)
Immature Granulocytes: 1 %
Lymphocytes Absolute: 1.4 10*3/uL (ref 0.7–3.1)
MCH: 30.2 pg (ref 26.6–33.0)
MCHC: 33.3 g/dL (ref 31.5–35.7)
MCV: 91 fL (ref 79–97)
Neutrophils: 66 %
Platelets: 174 10*3/uL (ref 150–450)
WBC: 6.2 10*3/uL (ref 3.4–10.8)

## 2022-09-15 LAB — C3 AND C4: Complement C4, Serum: 20 mg/dL (ref 12–38)

## 2022-09-15 LAB — TROPONIN T

## 2022-09-15 LAB — ANGIOTENSIN CONVERTING ENZYME: Angio Convert Enzyme: 6 U/L — ABNORMAL LOW (ref 14–82)

## 2022-09-15 NOTE — Telephone Encounter (Signed)
Per Dr. Lucie Leather, I spoke with Marcus Harding's wife and informed him that they need to contact his primary care physician to discuss his glucose levels today. They will need to adjust his medications.

## 2022-09-15 NOTE — Telephone Encounter (Signed)
-----   Message from Verlee Monte, MD sent at 09/15/2022  8:31 AM EDT ----- Good Morning,  Got a critical value read on this patient from [facility] last night at 3 AM.  Glucose is 500.  Sincerely, Denny Peon

## 2022-09-16 LAB — COMPREHENSIVE METABOLIC PANEL
ALT: 26 IU/L (ref 0–44)
AST: 17 IU/L (ref 0–40)
Albumin/Globulin Ratio: 2.9 — ABNORMAL HIGH (ref 1.2–2.2)
Albumin: 4.7 g/dL (ref 3.9–4.9)
Alkaline Phosphatase: 91 IU/L (ref 44–121)
BUN/Creatinine Ratio: 18 (ref 10–24)
BUN: 21 mg/dL (ref 8–27)
Bilirubin Total: 0.6 mg/dL (ref 0.0–1.2)
CO2: 20 mmol/L (ref 20–29)
Calcium: 10 mg/dL (ref 8.6–10.2)
Chloride: 99 mmol/L (ref 96–106)
Creatinine, Ser: 1.16 mg/dL (ref 0.76–1.27)
Globulin, Total: 1.6 g/dL (ref 1.5–4.5)
Glucose: 501 mg/dL (ref 70–99)
Potassium: 5.1 mmol/L (ref 3.5–5.2)
Sodium: 136 mmol/L (ref 134–144)
Total Protein: 6.3 g/dL (ref 6.0–8.5)
eGFR: 69 mL/min/{1.73_m2} (ref >59)

## 2022-09-16 LAB — CBC WITH DIFFERENTIAL/PLATELET
Basophils Absolute: 0.1 10*3/uL (ref 0.0–0.2)
Basos: 1 %
Eos: 1 %
Hematocrit: 44.8 % (ref 37.5–51.0)
Immature Grans (Abs): 0 10*3/uL (ref 0.0–0.1)
Lymphs: 23 %
Monocytes Absolute: 0.5 10*3/uL (ref 0.1–0.9)
Monocytes: 8 %
Neutrophils Absolute: 4.1 10*3/uL (ref 1.4–7.0)
RBC: 4.93 x10E6/uL (ref 4.14–5.80)
RDW: 13.7 % (ref 11.6–15.4)

## 2022-09-16 LAB — ANA W/REFLEX IF POSITIVE: Anti Nuclear Antibody (ANA): NEGATIVE

## 2022-09-16 LAB — C-REACTIVE PROTEIN: CRP: 2 mg/L (ref 0–10)

## 2022-09-16 LAB — SEDIMENTATION RATE: Sed Rate: 2 mm/hr (ref 0–30)

## 2022-09-16 LAB — C3 AND C4: Complement C3, Serum: 138 mg/dL (ref 82–167)

## 2022-09-16 LAB — BRAIN NATRIURETIC PEPTIDE: BNP: 11 pg/mL (ref 0.0–100.0)

## 2022-09-28 ENCOUNTER — Other Ambulatory Visit: Payer: Self-pay

## 2022-09-28 DIAGNOSIS — F17200 Nicotine dependence, unspecified, uncomplicated: Secondary | ICD-10-CM | POA: Insufficient documentation

## 2022-09-28 DIAGNOSIS — N400 Enlarged prostate without lower urinary tract symptoms: Secondary | ICD-10-CM | POA: Insufficient documentation

## 2022-09-28 DIAGNOSIS — M1990 Unspecified osteoarthritis, unspecified site: Secondary | ICD-10-CM | POA: Insufficient documentation

## 2022-09-28 DIAGNOSIS — E119 Type 2 diabetes mellitus without complications: Secondary | ICD-10-CM | POA: Insufficient documentation

## 2022-09-28 DIAGNOSIS — K274 Chronic or unspecified peptic ulcer, site unspecified, with hemorrhage: Secondary | ICD-10-CM | POA: Insufficient documentation

## 2022-09-28 DIAGNOSIS — G8929 Other chronic pain: Secondary | ICD-10-CM | POA: Insufficient documentation

## 2022-09-28 DIAGNOSIS — G629 Polyneuropathy, unspecified: Secondary | ICD-10-CM | POA: Insufficient documentation

## 2022-09-28 DIAGNOSIS — G2581 Restless legs syndrome: Secondary | ICD-10-CM | POA: Insufficient documentation

## 2022-09-28 DIAGNOSIS — K219 Gastro-esophageal reflux disease without esophagitis: Secondary | ICD-10-CM | POA: Insufficient documentation

## 2022-09-28 DIAGNOSIS — J449 Chronic obstructive pulmonary disease, unspecified: Secondary | ICD-10-CM | POA: Insufficient documentation

## 2022-09-28 HISTORY — DX: Morbid (severe) obesity due to excess calories: E66.01

## 2022-09-28 HISTORY — DX: Other chronic pain: G89.29

## 2022-09-29 DIAGNOSIS — M1712 Unilateral primary osteoarthritis, left knee: Secondary | ICD-10-CM | POA: Diagnosis not present

## 2022-09-29 DIAGNOSIS — Z794 Long term (current) use of insulin: Secondary | ICD-10-CM | POA: Diagnosis not present

## 2022-09-29 DIAGNOSIS — G4733 Obstructive sleep apnea (adult) (pediatric): Secondary | ICD-10-CM | POA: Diagnosis not present

## 2022-09-29 DIAGNOSIS — K219 Gastro-esophageal reflux disease without esophagitis: Secondary | ICD-10-CM | POA: Diagnosis not present

## 2022-09-29 DIAGNOSIS — I10 Essential (primary) hypertension: Secondary | ICD-10-CM | POA: Diagnosis not present

## 2022-09-29 DIAGNOSIS — J439 Emphysema, unspecified: Secondary | ICD-10-CM | POA: Diagnosis not present

## 2022-09-29 DIAGNOSIS — E1165 Type 2 diabetes mellitus with hyperglycemia: Secondary | ICD-10-CM | POA: Diagnosis not present

## 2022-09-29 DIAGNOSIS — E1129 Type 2 diabetes mellitus with other diabetic kidney complication: Secondary | ICD-10-CM | POA: Diagnosis not present

## 2022-09-29 DIAGNOSIS — Z6841 Body Mass Index (BMI) 40.0 and over, adult: Secondary | ICD-10-CM | POA: Diagnosis not present

## 2022-09-29 DIAGNOSIS — K449 Diaphragmatic hernia without obstruction or gangrene: Secondary | ICD-10-CM | POA: Diagnosis not present

## 2022-09-29 DIAGNOSIS — M5136 Other intervertebral disc degeneration, lumbar region: Secondary | ICD-10-CM | POA: Diagnosis not present

## 2022-09-29 DIAGNOSIS — J449 Chronic obstructive pulmonary disease, unspecified: Secondary | ICD-10-CM | POA: Diagnosis not present

## 2022-09-29 DIAGNOSIS — R809 Proteinuria, unspecified: Secondary | ICD-10-CM | POA: Diagnosis not present

## 2022-10-03 DIAGNOSIS — F331 Major depressive disorder, recurrent, moderate: Secondary | ICD-10-CM | POA: Diagnosis not present

## 2022-10-06 ENCOUNTER — Other Ambulatory Visit (HOSPITAL_COMMUNITY): Payer: Medicare HMO

## 2022-10-08 DIAGNOSIS — J439 Emphysema, unspecified: Secondary | ICD-10-CM | POA: Insufficient documentation

## 2022-10-08 DIAGNOSIS — E1129 Type 2 diabetes mellitus with other diabetic kidney complication: Secondary | ICD-10-CM | POA: Insufficient documentation

## 2022-10-08 DIAGNOSIS — K219 Gastro-esophageal reflux disease without esophagitis: Secondary | ICD-10-CM | POA: Insufficient documentation

## 2022-10-10 ENCOUNTER — Encounter: Payer: Self-pay | Admitting: Cardiology

## 2022-10-10 ENCOUNTER — Ambulatory Visit: Payer: Medicare HMO | Attending: Cardiology | Admitting: Cardiology

## 2022-10-10 VITALS — BP 132/84 | HR 78 | Ht 69.6 in | Wt 298.2 lb

## 2022-10-10 DIAGNOSIS — G4730 Sleep apnea, unspecified: Secondary | ICD-10-CM | POA: Diagnosis not present

## 2022-10-10 DIAGNOSIS — I2510 Atherosclerotic heart disease of native coronary artery without angina pectoris: Secondary | ICD-10-CM

## 2022-10-10 DIAGNOSIS — E119 Type 2 diabetes mellitus without complications: Secondary | ICD-10-CM

## 2022-10-10 DIAGNOSIS — Z7984 Long term (current) use of oral hypoglycemic drugs: Secondary | ICD-10-CM

## 2022-10-10 DIAGNOSIS — I10 Essential (primary) hypertension: Secondary | ICD-10-CM

## 2022-10-10 DIAGNOSIS — J431 Panlobular emphysema: Secondary | ICD-10-CM

## 2022-10-10 NOTE — Progress Notes (Signed)
Cardiology Office Note:    Date:  10/10/2022   ID:  Marcus Peat., DOB 14-Sep-1955, MRN [id number]  PCP:  Luanna Salk, PA-C  Cardiologist:  Garwin Brothers, MD   Referring MD: Noni Saupe, MD    ASSESSMENT:    1. Essential hypertension   2. Coronary artery disease involving native coronary artery of native heart without angina pectoris   3. Sleep apnea, unspecified type   4. Panlobular emphysema (HCC)   5. Type 2 diabetes mellitus without complication, without long-term current use of insulin (HCC)   6. Morbid obesity (HCC)    PLAN:    In order of problems listed above:  Mild coronary artery disease: Secondary prevention stressed with the patient.  Importance of compliance with diet medication stressed and he vocalized understanding.  He was advised to ambulate to the best of his ability. Essential hypertension: Blood pressure stable and diet was emphasized. Mixed dyslipidemia: On lipid-lowering medications followed by primary care.  Goal LDL should be less than 70. Diabetes mellitus and morbid obesity: Weight reduction stressed lifestyle modification urged and he promises to do better. Records including cardiac cath and MRI report reviewed with the patient and questions were answered to his and his wife satisfaction. Patient will be seen in follow-up appointment in 6 months or earlier if the patient has any concerns.    Medication Adjustments/Labs and Tests Ordered: Current medicines are reviewed at length with the patient today.  Concerns regarding medicines are outlined above.  No orders of the defined types were placed in this encounter.  No orders of the defined types were placed in this encounter.    No chief complaint on file.    History of Present Illness:    Marcus Gargus. is a 67 y.o. male.  Patient has past medical history of mild nonobstructive coronary artery disease, essential hypertension, mixed dyslipidemia and diabetes mellitus.   He is morbidly obese and leads a sedentary lifestyle.  He denies any chest pain orthopnea or PND.  At the time of my evaluation, the patient is alert awake oriented and in no distress.  He was found to have focal myocarditis on cardiac MRI I reviewed the reports.  Past Medical History:  Diagnosis Date   Abnormal stress test 06/12/2022   Adenocarcinoma of prostate (HCC) 11/26/2014   Allergic rhinitis 12/30/2014   Arthritis    Asthma    BPH (benign prostatic hyperplasia)    CAD (coronary artery disease) 06/12/2022   Chest pain 06/12/2022   Chest pain of uncertain etiology 06/12/2022   Chronic pain 09/28/2022   COPD (chronic obstructive pulmonary disease) (HCC)    Degenerative disc disease, lumbar 07/04/2012   Diabetes (HCC)    Elevated prostate specific antigen (PSA) 11/26/2014   Emphysema of lung (HCC) 11/26/2014   Essential hypertension 11/26/2014   Gait abnormality 06/04/2018   GERD (gastroesophageal reflux disease)    Hearing loss 11/26/2014   HTN (hypertension)    Knee osteoarthritis 07/04/2012   Low back pain 06/04/2018   LPRD (laryngopharyngeal reflux disease) 12/30/2014   Morbid obesity (HCC) 09/28/2022   Neck pain    Neuropathy    Paresthesia 06/04/2018   Peptic ulcer hemorrhagic    RLS (restless legs syndrome)    Sleep apnea 11/26/2014   Type 2 diabetes mellitus without complication (HCC) 12/12/2018    Past Surgical History:  Procedure Laterality Date   ABCESS DRAINAGE  2022   pt reports he had a "naval pus pocket"  APPENDECTOMY  2009   LEFT HEART CATH AND CORONARY ANGIOGRAPHY N/A 06/12/2022   Procedure: LEFT HEART CATH AND CORONARY ANGIOGRAPHY;  Surgeon: Kathleene Hazel, MD;  [facility];  Service: Cardiovascular;  Laterality: N/A;    Current Medications: Current Meds  Medication Sig   albuterol (VENTOLIN HFA) 108 (90 Base) MCG/ACT inhaler Inhale 1-2 puffs into the lungs as needed for shortness of breath or wheezing.    Budeson-Glycopyrrol-Formoterol (BREZTRI AEROSPHERE) 160-9-4.8 MCG/ACT AERO Inhale two puffs twice daily to prevent cough or wheeze.  Rinse, gargle, and spit after use.   buPROPion (WELLBUTRIN XL) 150 MG 24 hr tablet Take 300 mg by mouth daily.   Cyanocobalamin (B-12) 1000 MCG CAPS Take 1 capsule by mouth 2 (two) times daily.   dexlansoprazole (DEXILANT) 60 MG capsule Take one tablet once daily as directed   DULoxetine (CYMBALTA) 60 MG capsule Take 120 mg by mouth 2 (two) times daily.   famotidine (PEPCID) 40 MG tablet Take 1 tablet (40 mg total) by mouth at bedtime.   Insulin Glargine (BASAGLAR KWIKPEN) 100 UNIT/ML Inject 63 Units into the skin every morning.   lisinopril (ZESTRIL) 40 MG tablet Take 1 tablet by mouth daily.   metFORMIN (GLUCOPHAGE-XR) 500 MG 24 hr tablet Take 1,000 mg by mouth 2 (two) times daily.   pravastatin (PRAVACHOL) 40 MG tablet Take 40 mg by mouth daily.   rOPINIRole (REQUIP) 1 MG tablet Take 1.5 mg by mouth daily.   tamsulosin (FLOMAX) 0.4 MG CAPS capsule Take 0.4 mg by mouth at bedtime.     Allergies:   Cortisone and Semaglutide   Social History   Socioeconomic History   Marital status: Married    Spouse name: Not on file   Number of children: 0   Years of education: 12   Highest education level: High school graduate  Occupational History   Occupation: disabled  Tobacco Use   Smoking status: Former    Packs/day: 5.00    Years: 7.00    Additional pack years: 0.00    Total pack years: 35.00    Types: Cigars, Cigarettes    Quit date: 04/25/1979    Years since quitting: 43.4   Smokeless tobacco: Former    Types: Snuff    Quit date: 03/18/2005  Vaping Use   Vaping Use: Never used  Substance and Sexual Activity   Alcohol use: Yes    Alcohol/week: 0.0 standard drinks of alcohol    Comment: rare - very small amt   Drug use: No   Sexual activity: Not on file  Other Topics Concern   Not on file  Social History Narrative   Lives at home with wife.    Left-handed.   1 cup coffee, 1-2 soft drinks per day.   Social Determinants of Health   Financial Resource Strain: Not on file  Food Insecurity: Not on file  Transportation Needs: Not on file  Physical Activity: Not on file  Stress: Not on file  Social Connections: Not on file     Family History: The patient's family history includes Breast cancer in his paternal grandmother; Diabetes in his maternal grandmother; Hypertension in his brother and mother; Suicidality in his father.  ROS:   Please see the history of present illness.    All other systems reviewed and are negative.  EKGs/Labs/Other Studies Reviewed:    The following studies were reviewed today: LEFT HEART CATH AND CORONARY ANGIOGRAPHY   Conclusion      Prox RCA  lesion is 20% stenosed.   Prox Cx to Mid Cx lesion is 30% stenosed.   Mid LAD lesion is 20% stenosed.   Mild non-obstructive disease LVEDP 7 mmHg  Non-cardiac chest pain   Recommendations: Medical management of mild CAD with ASA and statin. No further cardiac workup. Will d/c home today after two hours of bedrest.    Recent Labs: 09/14/2022: ALT 26; BNP 11.0; BUN 21; Creatinine, Ser 1.16; Hemoglobin 14.9; Platelets 174; Potassium 5.1; Sodium 136  Recent Lipid Panel No results found for: "CHOL", "TRIG", "HDL", "CHOLHDL", "VLDL", "LDLCALC", "LDLDIRECT"  Physical Exam:    VS:  BP 132/84   Pulse 78   Ht 5' 9.6" (1.768 m)   Wt 298 lb 3.2 oz (135.3 kg)   SpO2 97%   BMI 43.28 kg/m     Wt Readings from Last 3 Encounters:  10/10/22 298 lb 3.2 oz (135.3 kg)  09/13/20 295 lb (133.8 kg)  09/10/19 (!) 326 lb 6.4 oz (148.1 kg)     GEN: Patient is in no acute distress HEENT: Normal NECK: No JVD; No carotid bruits LYMPHATICS: No lymphadenopathy CARDIAC: Hear sounds regular, 2/6 systolic murmur at the apex. RESPIRATORY:  Clear to auscultation without rales, wheezing or rhonchi  ABDOMEN: Soft, non-tender, non-distended MUSCULOSKELETAL:  No edema; No  deformity  SKIN: Warm and dry NEUROLOGIC:  Alert and oriented x 3 PSYCHIATRIC:  Normal affect   Signed, Garwin Brothers, MD  10/10/2022 3:26 PM    [facility]

## 2022-10-10 NOTE — Patient Instructions (Signed)
Medication Instructions:  Your physician recommends that you continue on your current medications as directed. Please refer to the Current Medication list given to you today.  *If you need a refill on your cardiac medications before your next appointment, please call your pharmacy*   Lab Work: None ordered If you have labs (blood work) drawn today and your tests are completely normal, you will receive your results only by: MyChart Message (if you have MyChart) OR A paper copy in the mail If you have any lab test that is abnormal or we need to change your treatment, we will call you to review the results.   Testing/Procedures: None ordered   Follow-Up: At [facility], you and your health needs are our priority.  As part of our continuing mission to provide you with exceptional heart care, we have created designated Provider Care Teams.  These Care Teams include your primary Cardiologist (physician) and Advanced Practice Providers (APPs -  Physician Assistants and Nurse Practitioners) who all work together to provide you with the care you need, when you need it.  We recommend signing up for the patient portal called "MyChart".  Sign up information is provided on this After Visit Summary.  MyChart is used to connect with patients for Virtual Visits (Telemedicine).  Patients are able to view lab/test results, encounter notes, upcoming appointments, etc.  Non-urgent messages can be sent to your provider as well.   To learn more about what you can do with MyChart, go to [email or web address].    Your next appointment:   9 month(s)  The format for your next appointment:   In Person  Provider:   Rajan Revankar, MD    Other Instructions none  Important Information About Sugar       

## 2022-10-11 ENCOUNTER — Telehealth: Payer: Self-pay | Admitting: Allergy and Immunology

## 2022-10-11 NOTE — Telephone Encounter (Signed)
Wife states patient went to the Cardiologist yesterday. They told him he had no blockage and that he just needed to lose weight quickly. They did not mention anything about the Cardiac MRI he had at [facility].

## 2022-11-08 DIAGNOSIS — I25110 Atherosclerotic heart disease of native coronary artery with unstable angina pectoris: Secondary | ICD-10-CM | POA: Diagnosis not present

## 2022-11-08 DIAGNOSIS — Z7689 Persons encountering health services in other specified circumstances: Secondary | ICD-10-CM | POA: Diagnosis not present

## 2022-11-09 ENCOUNTER — Encounter: Payer: Self-pay | Admitting: Family Medicine

## 2022-11-13 ENCOUNTER — Telehealth: Payer: Self-pay

## 2022-11-13 ENCOUNTER — Other Ambulatory Visit: Payer: Self-pay

## 2022-11-13 NOTE — Telephone Encounter (Signed)
Left detailed message on voicemail letting patient know that we did find one box of Paxlovid for him at [facility] on [address] in [place].  I told them to call us if they need [place]. Verbal order given to [facility].

## 2022-11-29 ENCOUNTER — Other Ambulatory Visit: Payer: Self-pay | Admitting: Allergy and Immunology

## 2023-05-07 ENCOUNTER — Ambulatory Visit: Payer: Medicare Other | Admitting: Allergy and Immunology

## 2023-05-07 VITALS — BP 126/82 | HR 67 | Resp 16

## 2023-05-07 DIAGNOSIS — J3089 Other allergic rhinitis: Secondary | ICD-10-CM | POA: Diagnosis not present

## 2023-05-07 DIAGNOSIS — I401 Isolated myocarditis: Secondary | ICD-10-CM | POA: Diagnosis not present

## 2023-05-07 DIAGNOSIS — K219 Gastro-esophageal reflux disease without esophagitis: Secondary | ICD-10-CM

## 2023-05-07 DIAGNOSIS — J4540 Moderate persistent asthma, uncomplicated: Secondary | ICD-10-CM | POA: Diagnosis not present

## 2023-05-07 DIAGNOSIS — U071 COVID-19: Secondary | ICD-10-CM

## 2023-05-07 MED ORDER — NIRMATRELVIR&RITONAVIR 300/100 20 X 150 MG & 10 X 100MG PO TBPK: 3.0000 | ORAL_TABLET | Freq: Two times a day (BID) | ORAL | 0 refills | Status: AC

## 2023-05-07 MED ORDER — IPRATROPIUM-ALBUTEROL 0.5-2.5 (3) MG/3ML IN SOLN: 3.0000 mL | Freq: Four times a day (QID) | RESPIRATORY_TRACT | 5 refills | Status: AC | PRN

## 2023-05-07 MED ORDER — ASMANEX HFA 200 MCG/ACT IN AERO: 2.0000 | INHALATION_SPRAY | Freq: Two times a day (BID) | RESPIRATORY_TRACT | 3 refills | Status: DC

## 2023-05-07 NOTE — Patient Instructions (Addendum)
  1. Treat reflux:  A. Dexilant  60 one tablet one time a day in morning B. Famotidine  40 mg - 1 tablet in evening  C. Minimize Dr. Pepper (caffeine)  2. Continue Breztri  - two inhalations two times per day (empty lungs)  3. If needed:  A.  Albuterol  -2 inhalations every 6 hours B.  Loratadine 10 mg - 1 tablet 1 time per day   4. For this recent event:   A. Duoneb administered in clinic today and every 6 hours if needed  B. Add Asmanex  200 - 2 inhalations 2 times per day  5. Discuss with doctor about low dose Mounjaro  6. Return in 3 months or earlier if problem

## 2023-05-07 NOTE — Progress Notes (Signed)
 [place] - [place] - [place]   Follow-up Note  Referring Provider: Mindi Prentice SQUIBB, PA-C Primary Provider: Mindi Prentice SQUIBB DEVONNA Date of Office Visit: 05/07/2023  Subjective:   Marcus Harding. (DOB: 1955-09-02) is a 68 y.o. male who [facility] in re-evaluation of the following:  HPI: Kia returns to this clinic in evaluation of asthma, allergic rhinoconjunctivitis, reflux, and chest pain syndrome.  I last saw him in this clinic 21 June 2022.  When I last saw Marcus Harding in this clinic he was having a persistent chest pain syndrome that apparently did not have a cardiac etiology as he had undergone a cardiac catheterization which identified no significant coronary artery disease.  At that point in time I added famotidine  to his proton pump inhibitor and he actually did get better for a prolonged period in time but developed some milder chest pain in the past month.  In addition, he became sick exactly 7 days ago.  He had sinus fullness and mucus coming out that was intermittently green and he had lots of fatigue and then he developed a cough.  His head has actually cleared up while he was using his wife's Augmentin over the course of the past 6 days but he still has a lot of cough and wheezing.  His COVID swab was positive 1-week ago.  In general he thinks his reflux is doing okay while using a proton pump inhibitor on a daily basis along with his famotidine .  He still does drink 2 Dr. Buster per day.  Prior to this respiratory tract infection he thought his airway was doing relatively well while using his Breztri  twice a day.  He does not really use any nasal steroid.  He has had some very significant hyperglycemia even though he is using multiple medications directed against this issue.  He was on Ozempic in the past but when he increased up to the moderate dose of Ozempic he developed diarrhea.  Allergies  as of 05/07/2023       Reactions   Cortisone Itching   Other reaction(s): ITCHING   Semaglutide    Other Reaction(s): Diarrhea Also Rybelsus        Medication List    albuterol  108 (90 Base) MCG/ACT inhaler Commonly known as: VENTOLIN  HFA Inhale 1-2 puffs into the lungs as needed for shortness of breath or wheezing.   B-12 1000 MCG Caps Take 1 capsule by mouth 2 (two) times daily.   Basaglar KwikPen 100 UNIT/ML Inject 63 Units into the skin every morning.   Breztri  Aerosphere 160-9-4.8 MCG/ACT Aero Generic drug: Budeson-Glycopyrrol-Formoterol Inhale two puffs twice daily to prevent cough or wheeze.  Rinse, gargle, and spit after use.   buPROPion 150 MG 24 hr tablet Commonly known as: WELLBUTRIN XL Take 300 mg by mouth daily.   dexlansoprazole  60 MG capsule Commonly known as: Dexilant  Take one tablet once daily as directed   DULoxetine 60 MG capsule Commonly known as: CYMBALTA Take 120 mg by mouth 2 (two) times daily.   famotidine  40 MG tablet Commonly known as: PEPCID  TAKE 1 TABLET (40 MG TOTAL) BY MOUTH AT BEDTIME.   lisinopril 40 MG tablet Commonly known as: ZESTRIL Take 1 tablet by mouth daily.   loratadine 10 MG tablet Commonly known as: CLARITIN Take 10 mg by mouth daily.   metFORMIN 500 MG 24 hr tablet Commonly known as: GLUCOPHAGE-XR Take 1,000 mg by mouth 2 (two) times  daily.   pravastatin 40 MG tablet Commonly known as: PRAVACHOL Take 40 mg by mouth daily.   rOPINIRole 1 MG tablet Commonly known as: REQUIP Take 1.5 mg by mouth daily.   tamsulosin 0.4 MG Caps capsule Commonly known as: FLOMAX Take 0.4 mg by mouth at bedtime.    Past Medical History:  Diagnosis Date   Abnormal stress test 06/12/2022   Adenocarcinoma of prostate (HCC) 11/26/2014   Allergic rhinitis 12/30/2014   Arthritis    Asthma    BPH (benign prostatic hyperplasia)    CAD (coronary artery disease) 06/12/2022   Chest pain 06/12/2022   Chest pain of uncertain  etiology 06/12/2022   Chronic pain 09/28/2022   COPD (chronic obstructive pulmonary disease) (HCC)    Degenerative disc disease, lumbar 07/04/2012   Diabetes (HCC)    Elevated prostate specific antigen (PSA) 11/26/2014   Emphysema of lung (HCC) 11/26/2014   Essential hypertension 11/26/2014   Gait abnormality 06/04/2018   GERD (gastroesophageal reflux disease)    Hearing loss 11/26/2014   HTN (hypertension)    Knee osteoarthritis 07/04/2012   Low back pain 06/04/2018   LPRD (laryngopharyngeal reflux disease) 12/30/2014   Morbid obesity (HCC) 09/28/2022   Neck pain    Neuropathy    Paresthesia 06/04/2018   Peptic ulcer hemorrhagic    RLS (restless legs syndrome)    Sleep apnea 11/26/2014   Type 2 diabetes mellitus without complication (HCC) 12/12/2018    Past Surgical History:  Procedure Laterality Date   ABCESS DRAINAGE  2022   pt reports he had a naval pus pocket   APPENDECTOMY  2009   LEFT HEART CATH AND CORONARY ANGIOGRAPHY N/A 06/12/2022   Procedure: LEFT HEART CATH AND CORONARY ANGIOGRAPHY;  Surgeon: Verlin Lonni BIRCH, MD;  [facility];  Service: Cardiovascular;  Laterality: N/A;    Review of systems negative except as noted in HPI / PMHx or noted below:  Review of Systems  Constitutional: Negative.   HENT: Negative.    Eyes: Negative.   Respiratory: Negative.    Cardiovascular: Negative.   Gastrointestinal: Negative.   Genitourinary: Negative.   Musculoskeletal: Negative.   Skin: Negative.   Neurological: Negative.   Endo/Heme/Allergies: Negative.   Psychiatric/Behavioral: Negative.       Objective:   Vitals:   05/07/23 1043  BP: 126/82  Pulse: 67  Resp: 16  SpO2: 98%          Physical Exam Constitutional:      Appearance: He is not diaphoretic.  HENT:     Head: Normocephalic.     Right Ear: Tympanic membrane, ear canal and external ear normal.     Left Ear: Tympanic membrane, ear canal and external ear normal.      Nose: Nose normal. No mucosal edema or rhinorrhea.     Mouth/Throat:     Pharynx: Uvula midline. No oropharyngeal exudate.  Eyes:     Conjunctiva/sclera: Conjunctivae normal.  Neck:     Thyroid : No thyromegaly.     Trachea: Trachea normal. No tracheal tenderness or tracheal deviation.  Cardiovascular:     Rate and Rhythm: Normal rate and regular rhythm.     Heart sounds: Normal heart sounds, S1 normal and S2 normal. No murmur heard. Pulmonary:     Effort: No respiratory distress.     Breath sounds: No stridor. Wheezing (Bilateral expiratory) present. No rales.  Lymphadenopathy:     Head:     Right side of head: No tonsillar adenopathy.  Left side of head: No tonsillar adenopathy.     Cervical: No cervical adenopathy.  Skin:    Findings: No erythema or rash.     Nails: There is no clubbing.  Neurological:     Mental Status: He is alert.     Diagnostics:   Results of a cardiac MRI obtained 06 Sep 2022 identifies the following:  1.  Normal LV size and function.  LVEF 67%  2.  LV Midwall, basal lateral LGE/scar noted.  3.  LGE/Scar comprises 7.02g (5% of total LV myocardial mass).  4.  Normal RV size and function.  5.  Normal pericardial size with no evidence for thickening or inflammation.  6.  Findings suggest focal myocarditis.  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  Assessment and Plan:   1. Not well controlled moderate persistent asthma   2. Other allergic rhinitis   3. LPRD (laryngopharyngeal reflux disease)   4. Chronic isolated myocarditis   5. COVID-19 virus infection     1. Treat reflux:  A. Dexilant  60 one tablet one time a day in morning B. Famotidine  40 mg - 1 tablet in evening  C. Minimize Dr. Pepper (caffeine)  2. Continue Breztri  - two inhalations two times per day (empty lungs)  3. If needed:  A.  Albuterol  -2 inhalations every 6 hours B.  Loratadine 10 mg - 1 tablet 1 time per day   4. For this recent event:   A. Duoneb administered in clinic today  and every 6 hours if needed  B. Add Asmanex  200 - 2 inhalations 2 times per day  5. Discuss with doctor about low dose Mounjaro  6. Return in 3 months or earlier if problem  It appears that Jacobs has had a COVID infection and he is actually a little bit better than he was 7 days ago and for that reason we are not going to give him any additional antibiotics or antiviral agents and we will just focus on the inflammation that has developed as a result of this issue as noted above.  Overall he is done pretty well addressing issues of reflux and addressing issues of inflammation of his airway as noted above.  He is having very significant hyperglycemia and I did ask him to talk with his doctor about using a low-dose of Mounjaro.  He has had problems tolerating higher doses of GLP-1 agonist in the past.  He has seen cardiology regarding his focal myocarditis and we are not going to pursue this issue at this point in time and he can follow-up with cardiology regarding that issue.  Camellia Denis, MD Allergy / Immunology [facility]

## 2023-05-08 ENCOUNTER — Encounter: Payer: Self-pay | Admitting: Allergy and Immunology

## 2023-05-08 ENCOUNTER — Telehealth: Payer: Self-pay | Admitting: Allergy and Immunology

## 2023-05-08 NOTE — Telephone Encounter (Signed)
 Patient states they are not able to get Asmanex through the [facility] in [place]. Patient would like to know if there is an alternative.

## 2023-05-09 NOTE — Telephone Encounter (Signed)
 Talked with Marcus Harding.  She was able to give me the telephone number for the [facility] assistance program ( [phone]).  I called and left a message for Shelvy Dickens to call me back regarding alternatives for the Asmanex.

## 2023-05-09 NOTE — Telephone Encounter (Signed)
**Note De-identified  [place] Obfuscation** Please advise 

## 2023-05-10 MED ORDER — QVAR REDIHALER 80 MCG/ACT IN AERB: INHALATION_SPRAY | RESPIRATORY_TRACT | 5 refills | Status: DC

## 2023-05-10 NOTE — Telephone Encounter (Signed)
Still awaiting a return call from [facility].  But in the meantime, per Dr. Lucie Leather, can try Qvar 80- two puffs twice daily to see if that is covered.  Called and informed patient.  ERX sent to [facility].

## 2023-05-10 NOTE — Addendum Note (Signed)
Addended by: Alphonzo Cruise on: 05/10/2023 05:32 PM   Modules accepted: Orders

## 2023-05-11 NOTE — Telephone Encounter (Signed)
Patient called back and states Marylene Land told him this are the inhalers she can get for him :Combivent Respimat, Bevespi Aerosphere, Stiolto Respimat

## 2023-05-14 ENCOUNTER — Telehealth: Payer: Self-pay | Admitting: *Deleted

## 2023-05-14 ENCOUNTER — Other Ambulatory Visit: Payer: Self-pay | Admitting: *Deleted

## 2023-05-14 MED ORDER — PREDNISONE 10 MG PO TABS: ORAL_TABLET | ORAL | 0 refills | Status: DC

## 2023-05-14 MED ORDER — AZITHROMYCIN 500 MG PO TABS: ORAL_TABLET | ORAL | 0 refills | Status: DC

## 2023-05-14 NOTE — Telephone Encounter (Signed)
Marcus Harding called to inform you that he is not any better since his last visit with you. He still has all the same symptoms. Please advise.

## 2023-05-14 NOTE — Telephone Encounter (Signed)
Left voicemail for patient to return call. Qvar was sent in last Thursday. Did we get the prescription?

## 2023-05-14 NOTE — Telephone Encounter (Signed)
Patient informed and rx sent. 

## 2023-05-15 NOTE — Telephone Encounter (Signed)
Patients wife called stating she found one of her Asmanex inhalers and gave it to patient to use. She states he can use it in the meantime while they find out how to afford future fills.

## 2023-05-17 ENCOUNTER — Ambulatory Visit: Payer: Medicare Other | Admitting: Allergy and Immunology

## 2023-05-17 VITALS — BP 150/84 | HR 66 | Resp 24

## 2023-05-17 DIAGNOSIS — J3089 Other allergic rhinitis: Secondary | ICD-10-CM | POA: Diagnosis not present

## 2023-05-17 DIAGNOSIS — J4540 Moderate persistent asthma, uncomplicated: Secondary | ICD-10-CM | POA: Diagnosis not present

## 2023-05-17 DIAGNOSIS — U099 Post covid-19 condition, unspecified: Secondary | ICD-10-CM

## 2023-05-17 DIAGNOSIS — K219 Gastro-esophageal reflux disease without esophagitis: Secondary | ICD-10-CM | POA: Diagnosis not present

## 2023-05-17 DIAGNOSIS — R053 Chronic cough: Secondary | ICD-10-CM

## 2023-05-17 NOTE — Telephone Encounter (Signed)
Talked with patient and let him know that we did receive a letter that stated that QVAR is an alternative for the Asmanex (which is not covered).  I asked him to see about pricing at pharmacy and let us know if it is unaffordable.

## 2023-05-17 NOTE — Progress Notes (Signed)
Marcus Harding - [place] - [place] - [place]   Follow-up Note  Referring Provider: Luanna Salk, PA-C Primary Provider: Hurshel Harding Date of Office Visit: 05/17/2023  Subjective:   Marcus Harding. (DOB: 1955/11/17) is a 68 y.o. male who [facility] in re-evaluation of the following:  HPI: Marcus Harding returns to this clinic in evaluation of asthma, allergic rhinoconjunctivitis, reflux, chest pain syndrome, and COVID.  I last saw him in his clinic 07 May 2023.  The last time he presented to this clinic he was 7 days into a COVID infection.  His head actually cleared somewhat but he still had lots of chest congestion with coughing and wheezing and was using his bronchodilator on a pretty consistent basis.  We had him add Asmanex to his Breztri and did not really improve that much.  We started him on azithromycin and give him a low-dose of prednisone at 20 mg a day for 3 days.  He still has lots of coughing and it is disturbing his sleep and now has developed some irritation with his throat.  He feels as though his neck is a little bit swollen and feels though he has low bit of sore throat and a little bit of raspy voice.  He has not had any fever or chest pain worse sputum production and he has no significant upper airway symptoms.  Allergies as of 05/17/2023       Reactions   Cortisone Itching   Other reaction(s): ITCHING   Semaglutide    Other Reaction(s): Diarrhea Also Rybelsus        Medication List    albuterol 108 (90 Base) MCG/ACT inhaler Commonly known as: VENTOLIN HFA Inhale 1-2 puffs into the lungs as needed for shortness of breath or wheezing.   Asmanex HFA 200 MCG/ACT Aero Generic drug: Mometasone Furoate Inhale 2 puffs into the lungs in the morning and at bedtime.   azithromycin 500 MG tablet Commonly known as: Zithromax Take 1 tablet once daily for 3 days   B-12 1000 MCG Caps Take 1  capsule by mouth 2 (two) times daily.   Basaglar KwikPen 100 UNIT/ML Inject 63 Units into the skin every morning.   Breztri Aerosphere 160-9-4.8 MCG/ACT Aero Generic drug: Budeson-Glycopyrrol-Formoterol Inhale two puffs twice daily to prevent cough or wheeze.  Rinse, gargle, and spit after use.   buPROPion 150 MG 24 hr tablet Commonly known as: WELLBUTRIN XL Take 300 mg by mouth daily.   dexlansoprazole 60 MG capsule Commonly known as: Dexilant Take one tablet once daily as directed   DULoxetine 60 MG capsule Commonly known as: CYMBALTA Take 120 mg by mouth 2 (two) times daily.   famotidine 40 MG tablet Commonly known as: PEPCID TAKE 1 TABLET (40 MG TOTAL) BY MOUTH AT BEDTIME.   ipratropium-albuterol 0.5-2.5 (3) MG/3ML Soln Commonly known as: DUONEB Take 3 mLs by nebulization every 6 (six) hours as needed.   lisinopril 40 MG tablet Commonly known as: ZESTRIL Take 1 tablet by mouth daily.   loratadine 10 MG tablet Commonly known as: CLARITIN Take 10 mg by mouth daily.   metFORMIN 500 MG 24 hr tablet Commonly known as: GLUCOPHAGE-XR Take 1,000 mg by mouth 2 (two) times daily.   pravastatin 40 MG tablet Commonly known as: PRAVACHOL Take 40 mg by mouth daily.   predniSONE 10 MG tablet Commonly known as: DELTASONE Take 2 tablets once daily for 3 days   Qvar  RediHaler 80 MCG/ACT inhaler Generic drug: beclomethasone Inhale two puffs twice daily as directed.  Rinse, gargle, and spit after use.   rOPINIRole 1 MG tablet Commonly known as: REQUIP Take 1.5 mg by mouth daily.   tamsulosin 0.4 MG Caps capsule Commonly known as: FLOMAX Take 0.4 mg by mouth at bedtime.    Past Medical History:  Diagnosis Date   Abnormal stress test 06/12/2022   Adenocarcinoma of prostate (HCC) 11/26/2014   Allergic rhinitis 12/30/2014   Arthritis    Asthma    BPH (benign prostatic hyperplasia)    CAD (coronary artery disease) 06/12/2022   Chest pain 06/12/2022   Chest pain of  uncertain etiology 06/12/2022   Chronic pain 09/28/2022   COPD (chronic obstructive pulmonary disease) (HCC)    Degenerative disc disease, lumbar 07/04/2012   Diabetes (HCC)    Elevated prostate specific antigen (PSA) 11/26/2014   Emphysema of lung (HCC) 11/26/2014   Essential hypertension 11/26/2014   Gait abnormality 06/04/2018   GERD (gastroesophageal reflux disease)    Hearing loss 11/26/2014   HTN (hypertension)    Knee osteoarthritis 07/04/2012   Low back pain 06/04/2018   LPRD (laryngopharyngeal reflux disease) 12/30/2014   Morbid obesity (HCC) 09/28/2022   Neck pain    Neuropathy    Paresthesia 06/04/2018   Peptic ulcer hemorrhagic    RLS (restless legs syndrome)    Sleep apnea 11/26/2014   Type 2 diabetes mellitus without complication (HCC) 12/12/2018    Past Surgical History:  Procedure Laterality Date   ABCESS DRAINAGE  2022   pt reports he had a "naval pus pocket"   APPENDECTOMY  2009   LEFT HEART CATH AND CORONARY ANGIOGRAPHY N/A 06/12/2022   Procedure: LEFT HEART CATH AND CORONARY ANGIOGRAPHY;  Surgeon: Kathleene Hazel, MD;  [facility];  Service: Cardiovascular;  Laterality: N/A;    Review of systems negative except as noted in HPI / PMHx or noted below:  Review of Systems  Constitutional: Negative.   HENT: Negative.    Eyes: Negative.   Respiratory: Negative.    Cardiovascular: Negative.   Gastrointestinal: Negative.   Genitourinary: Negative.   Musculoskeletal: Negative.   Skin: Negative.   Neurological: Negative.   Endo/Heme/Allergies: Negative.   Psychiatric/Behavioral: Negative.       Objective:   Vitals:   05/17/23 1609 05/17/23 1646  BP: (!) 142/82 (!) 150/84  Pulse: 66   Resp: (!) 24   SpO2: 95%           Physical Exam Constitutional:      Appearance: He is not diaphoretic.  HENT:     Head: Normocephalic.     Right Ear: Tympanic membrane, ear canal and external ear normal.     Left Ear: Tympanic  membrane, ear canal and external ear normal.     Nose: Nose normal. No mucosal edema or rhinorrhea.     Mouth/Throat:     Pharynx: Uvula midline. No oropharyngeal exudate.  Eyes:     Conjunctiva/sclera: Conjunctivae normal.  Neck:     Thyroid: No thyromegaly.     Trachea: Trachea normal. No tracheal tenderness or tracheal deviation.  Cardiovascular:     Rate and Rhythm: Normal rate and regular rhythm.     Heart sounds: Normal heart sounds, S1 normal and S2 normal. No murmur heard. Pulmonary:     Effort: No respiratory distress.     Breath sounds: Normal breath sounds. No stridor. No wheezing or rales.  Lymphadenopathy:  Head:     Right side of head: No tonsillar adenopathy.     Left side of head: No tonsillar adenopathy.     Cervical: No cervical adenopathy.  Skin:    Findings: No erythema or rash.     Nails: There is no clubbing.  Neurological:     Mental Status: He is alert.     Diagnostics: Spirometry was performed and demonstrated an FEV1 of 2.30 at 73 % of predicted.  Assessment and Plan:   1. Asthma, moderate persistent, well-controlled   2. Other allergic rhinitis   3. LPRD (laryngopharyngeal reflux disease)   4. Post-COVID chronic cough    1.  Continue to treat reflux:  A. Dexilant 60 one tablet one time a day in morning B. Famotidine 40 mg - 1 tablet in evening  C. Minimize Dr. Reino Kent (caffeine)  2. Continue to prevent inflammation of airway:   A. Breztri - two inhalations two times per day (empty lungs)  3. If needed:  A.  Albuterol -2 inhalations every 6 hours B.  Loratadine 10 mg - 1 tablet 1 time per day   4. For this recent event:   A. Duoneb administered every 6 hours if needed  B. Add Qvar 80 (Asmanex) - 2 inhalations 2 times per day  C. OTC cough medications  5. Return in 3 months or earlier if problem  6. Influenza = Tamiflu  Melton's lungs actually sound clear and his spirometry is actually very good and his anti-inflammatory  therapy is working pretty good but he is having a persistent cough as a result of his COVID infection and this may actually go on for many more weeks.  He can use some over-the-counter cough medicine while he remains on anti-inflammatory agents for his airway and also addresses the issue of reflux as noted above.  I am going to assume he is going to resolve this issue in a week or 2.  I will see him back in this clinic in 3 months or earlier if there is a problem.   Laurette Schimke, MD Allergy / Immunology [facility]

## 2023-05-17 NOTE — Patient Instructions (Addendum)
  1.  Continue to treat reflux:  A. Dexilant 60 one tablet one time a day in morning B. Famotidine 40 mg - 1 tablet in evening  C. Minimize Dr. Reino Kent (caffeine)  2. Continue to prevent inflammation of airway:   A. Breztri - two inhalations two times per day (empty lungs)  3. If needed:  A.  Albuterol -2 inhalations every 6 hours B.  Loratadine 10 mg - 1 tablet 1 time per day   4. For this recent event:   A. Duoneb every 6 hours if needed  B. Add Qvar 80 (Asmanex) - 2 inhalations 2 times per day  C. OTC cough medications  5. Return in 3 months or earlier if problem  6. Influenza = Tamiflu

## 2023-05-21 ENCOUNTER — Other Ambulatory Visit: Payer: Self-pay | Admitting: Allergy and Immunology

## 2023-05-21 ENCOUNTER — Encounter: Payer: Self-pay | Admitting: Allergy and Immunology

## 2023-05-21 MED ORDER — HYDROCOD POLI-CHLORPHE POLI ER 10-8 MG/5ML PO SUER: ORAL | 0 refills | Status: DC

## 2023-05-21 NOTE — Telephone Encounter (Signed)
Talked with wife, Benay Pike, and Qvar is going to be over $100.  I left a message with Marylene Land letting her know that the alternative inhalers she mentioned do not have a steroid component and I gave her the names of a few other inhalers to consider (Qvar, Alvesco, Fluticasone, Pulmicort).  I asked her to call us back and let us know.

## 2023-05-21 NOTE — Addendum Note (Signed)
Addended by: Alphonzo Cruise on: 05/21/2023 02:52 PM   Modules accepted: Orders

## 2023-05-21 NOTE — Telephone Encounter (Signed)
Per Dr. Lucie Leather, patient can try Tussionex 2.5 to 5 mL at night for cough, quantity of 50 mL with no refills.

## 2023-05-21 NOTE — Telephone Encounter (Signed)
Wife states patient coughed all weekend nonstop and did not get any sleep. Wife said he took Dexilant and it did not help. They are requesting a cough medicine to be sent to [facility].

## 2023-05-21 NOTE — Telephone Encounter (Signed)
Called Marcus Harding and informed her about Tussionex RX.  She is planning to come by office to pick up printed RX today.  I did let her know that since Tussionex is a narcotic it can make patient think a little different and may cause constipation.

## 2023-05-21 NOTE — Addendum Note (Signed)
Addended by: Alphonzo Cruise on: 05/21/2023 02:48 PM   Modules accepted: Orders

## 2023-05-23 NOTE — Telephone Encounter (Signed)
Talked with Marcus Harding from [facility] and the inhalers she mentioned earlier to [place]( Combivent, Bevespi, Microbiologist) are the only ones that [place] and [place] qualify for on the assistance program. They would have to get other inhalers through regular pharmacy/mail order pharmacy.

## 2023-08-06 ENCOUNTER — Ambulatory Visit: Payer: Medicare Other | Admitting: Allergy and Immunology

## 2023-08-06 ENCOUNTER — Encounter: Payer: Self-pay | Admitting: Allergy and Immunology

## 2023-08-06 VITALS — BP 128/78 | HR 76 | Resp 20

## 2023-08-06 DIAGNOSIS — J4540 Moderate persistent asthma, uncomplicated: Secondary | ICD-10-CM

## 2023-08-06 DIAGNOSIS — J3089 Other allergic rhinitis: Secondary | ICD-10-CM

## 2023-08-06 DIAGNOSIS — K219 Gastro-esophageal reflux disease without esophagitis: Secondary | ICD-10-CM

## 2023-08-06 NOTE — Patient Instructions (Addendum)
  1.  Continue to treat reflux:  A. Dexilant 60 one tablet one time a day in morning B. Famotidine 40 mg - 1 tablet in evening  C. Continue to minimize Dr. Kathlene Paradise   2. Continue to prevent inflammation of airway:   A. Breztri - two inhalations two times per day (empty lungs)  3. If needed:  A.  Albuterol -2 inhalations every 6 hours B.  Loratadine 10 mg - 1 tablet 1 time per day   C. OTC cough medications  4. For asthma flare up:   A. Add Duoneb every 6 hours if needed  B. Add Asmanex - 2 inhalations 2 times per day  5. Return in 6 months or earlier if problem  6. Influenza = Tamiflu. Covid = Paxlovid

## 2023-08-06 NOTE — Progress Notes (Unsigned)
 [place] - [place] - [place] - [place] - [place]   Follow-up Note  Referring Provider: Luanna Salk, PA-C Primary Provider: Hurshel Keys Date of Office Visit: 08/06/2023  Subjective:   Marcus Harding. (DOB: June 18, 1955) is a 68 y.o. male who [facility] in re-evaluation of the following:  HPI: Marcus Harding returns to this clinic in evaluation of asthma, allergic rhinoconjunctivitis, reflux, chest pain syndrome.  I last saw him in his clinic 17 May 2023.  He has done really well regarding his asthma since have last seen him in this clinic.  He does not need to use any albuterol.  He has recently contracted an upper respiratory tract infection with some cough but it did not really precipitate much asthma.  He has not required a systemic steroid to treat any type of airway issue.  He does continue on [place] on a consistent basis.  He has had very little problems with his upper airway other than his recent viral respiratory tract infection.  He has not required an antibiotic to treat an episode of sinusitis.  His reflux is under very good control while using Dexilant and famotidine.  He has decreased his Dr. Reino Kent to 1 or 2 times per day and there is a plan to continue this tapering.  Allergies as of 08/06/2023       Reactions   Cortisone Itching   Other reaction(s): ITCHING   Semaglutide    Other Reaction(s): Diarrhea Also Rybelsus        Medication List    albuterol 108 (90 Base) MCG/ACT inhaler Commonly known as: VENTOLIN HFA Inhale 1-2 puffs into the lungs as needed for shortness of breath or wheezing.   Asmanex HFA 200 MCG/ACT Aero Generic drug: Mometasone Furoate Inhale 2 puffs into the lungs in the morning and at bedtime.   B-12 1000 MCG Caps Take 1 capsule by mouth 2 (two) times daily.   Basaglar KwikPen 100 UNIT/ML Inject 63 Units into the skin every morning.   Breztri Aerosphere 160-9-4.8  MCG/ACT Aero inhaler Generic drug: budeson-glycopyrrolate-formoterol Inhale two puffs twice daily to prevent cough or wheeze.  Rinse, gargle, and spit after use.   buPROPion 150 MG 24 hr tablet Commonly known as: WELLBUTRIN XL Take 300 mg by mouth daily.   chlorpheniramine-HYDROcodone 10-8 MG/5ML Commonly known as: TUSSIONEX 2.5 mls every 12 hours if needed for cough   dapagliflozin propanediol 10 MG Tabs tablet Commonly known as: FARXIGA Take 1 tablet by mouth daily.   dexlansoprazole 60 MG capsule Commonly known as: Dexilant Take one tablet once daily as directed   DULoxetine 60 MG capsule Commonly known as: CYMBALTA Take 120 mg by mouth 2 (two) times daily.   famotidine 40 MG tablet Commonly known as: PEPCID TAKE 1 TABLET (40 MG TOTAL) BY MOUTH AT BEDTIME.   Invokana 100 MG Tabs tablet Generic drug: canagliflozin Take 100 mg by mouth every morning.   ipratropium-albuterol 0.5-2.5 (3) MG/3ML Soln Commonly known as: DUONEB Take 3 mLs by nebulization every 6 (six) hours as needed.   lisinopril 40 MG tablet Commonly known as: ZESTRIL Take 1 tablet by mouth daily.   loratadine 10 MG tablet Commonly known as: CLARITIN Take 10 mg by mouth daily.   metFORMIN 500 MG 24 hr tablet Commonly known as: GLUCOPHAGE-XR Take 1,000 mg by mouth 2 (two) times daily.   pravastatin 40 MG tablet Commonly known as: PRAVACHOL Take 40 mg by mouth daily.  rOPINIRole 1 MG tablet Commonly known as: REQUIP Take 1.5 mg by mouth daily.   tamsulosin 0.4 MG Caps capsule Commonly known as: FLOMAX Take 0.4 mg by mouth at bedtime.   traZODone 50 MG tablet Commonly known as: DESYREL Take 50 mg by mouth at bedtime.    Past Medical History:  Diagnosis Date   Abnormal stress test 06/12/2022   Adenocarcinoma of prostate (HCC) 11/26/2014   Allergic rhinitis 12/30/2014   Arthritis    Asthma    BPH (benign prostatic hyperplasia)    CAD (coronary artery disease) 06/12/2022   Chest pain  06/12/2022   Chest pain of uncertain etiology 06/12/2022   Chronic pain 09/28/2022   COPD (chronic obstructive pulmonary disease) (HCC)    Degenerative disc disease, lumbar 07/04/2012   Diabetes (HCC)    Elevated prostate specific antigen (PSA) 11/26/2014   Emphysema of lung (HCC) 11/26/2014   Essential hypertension 11/26/2014   Gait abnormality 06/04/2018   GERD (gastroesophageal reflux disease)    Hearing loss 11/26/2014   HTN (hypertension)    Knee osteoarthritis 07/04/2012   Low back pain 06/04/2018   LPRD (laryngopharyngeal reflux disease) 12/30/2014   Morbid obesity (HCC) 09/28/2022   Neck pain    Neuropathy    Paresthesia 06/04/2018   Peptic ulcer hemorrhagic    RLS (restless legs syndrome)    Sleep apnea 11/26/2014   Type 2 diabetes mellitus without complication (HCC) 12/12/2018    Past Surgical History:  Procedure Laterality Date   ABCESS DRAINAGE  2022   pt reports he had a "naval pus pocket"   APPENDECTOMY  2009   LEFT HEART CATH AND CORONARY ANGIOGRAPHY N/A 06/12/2022   Procedure: LEFT HEART CATH AND CORONARY ANGIOGRAPHY;  Surgeon: Kathleene Hazel, MD;  Location: [facility];  Service: Cardiovascular;  Laterality: N/A;    Review of systems negative except as noted in HPI / PMHx or noted below:  Review of Systems  Constitutional: Negative.   HENT: Negative.    Eyes: Negative.   Respiratory: Negative.    Cardiovascular: Negative.   Gastrointestinal: Negative.   Genitourinary: Negative.   Musculoskeletal: Negative.   Skin: Negative.   Neurological: Negative.   Endo/Heme/Allergies: Negative.   Psychiatric/Behavioral: Negative.       Objective:   Vitals:   08/06/23 1014  BP: 128/78  Pulse: 76  Resp: 20  SpO2: 94%          Physical Exam Constitutional:      Appearance: He is not diaphoretic.  HENT:     Head: Normocephalic.     Right Ear: External ear normal. There is impacted cerumen.     Left Ear: Tympanic membrane, ear canal  and external ear normal.     Nose: Nose normal. No mucosal edema or rhinorrhea.     Mouth/Throat:     Pharynx: Uvula midline. No oropharyngeal exudate.  Eyes:     Conjunctiva/sclera: Conjunctivae normal.  Neck:     Thyroid: No thyromegaly.     Trachea: Trachea normal. No tracheal tenderness or tracheal deviation.  Cardiovascular:     Rate and Rhythm: Normal rate and regular rhythm.     Heart sounds: Normal heart sounds, S1 normal and S2 normal. No murmur heard. Pulmonary:     Effort: No respiratory distress.     Breath sounds: Normal breath sounds. No stridor. No wheezing or rales.  Lymphadenopathy:     Head:     Right side of head: No tonsillar adenopathy.  Left side of head: No tonsillar adenopathy.     Cervical: No cervical adenopathy.  Skin:    Findings: No erythema or rash.     Nails: There is no clubbing.  Neurological:     Mental Status: He is alert.     Diagnostics: Spirometry was performed and demonstrated an FEV1 of 2.29 at 72 % of predicted.  Assessment and Plan:   1. Asthma, moderate persistent, well-controlled   2. Other allergic rhinitis   3. LPRD (laryngopharyngeal reflux disease)    1.  Continue to treat reflux:  A. Dexilant 60 one tablet one time a day in morning B. Famotidine 40 mg - 1 tablet in evening  C. Continue to minimize Dr. Kathlene Paradise   2. Continue to prevent inflammation of airway:   A. Breztri - two inhalations two times per day (empty lungs)  3. If needed:  A.  Albuterol -2 inhalations every 6 hours B.  Loratadine 10 mg - 1 tablet 1 time per day   C. OTC cough medications  4. For asthma flare up:   A. Add Duoneb every 6 hours if needed  B. Add Asmanex - 2 inhalations 2 times per day  5. Return in 6 months or earlier if problem  6. Influenza = Tamiflu. Covid = Paxlovid  Talik appears to be doing very well regarding his airway issue which is a combination of inflammation of both his upper and lower airway and his reflux induced  respiratory disease and Haleraid maintain therapy with anti-inflammatory agents for his airway and aggressive therapy directed against reflux as noted above.  Assuming he does well with this plan I will see him back in this clinic in 6 months or earlier if there is a problem.  Schuyler Custard, MD Allergy / Immunology [facility]

## 2023-08-07 ENCOUNTER — Encounter: Payer: Self-pay | Admitting: Allergy and Immunology

## 2023-09-27 ENCOUNTER — Ambulatory Visit: Payer: Medicare HMO | Admitting: Allergy and Immunology

## 2024-02-06 ENCOUNTER — Ambulatory Visit: Admitting: Allergy and Immunology

## 2024-02-06 ENCOUNTER — Encounter: Payer: Self-pay | Admitting: Allergy and Immunology

## 2024-02-06 VITALS — BP 126/66 | HR 56 | Resp 10

## 2024-02-06 DIAGNOSIS — K219 Gastro-esophageal reflux disease without esophagitis: Secondary | ICD-10-CM | POA: Diagnosis not present

## 2024-02-06 DIAGNOSIS — J3089 Other allergic rhinitis: Secondary | ICD-10-CM | POA: Diagnosis not present

## 2024-02-06 DIAGNOSIS — J4540 Moderate persistent asthma, uncomplicated: Secondary | ICD-10-CM | POA: Diagnosis not present
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
## 2024-02-06 NOTE — Patient Instructions (Addendum)
  1.  Continue to treat reflux:  A. Famotidine  20 mg - 1 tablet in evening   2. Continue to prevent inflammation of airway:   A. Breztri  - 2 inhalations two times per day (empty lungs) B. Nasonex - 1-2 sprays each nostril 1 time per day  3. If needed:  A.  Albuterol  -2 inhalations or Duoneb every 6 hours B.  Loratadine 10 mg - 1 tablet 1 time per day   4. Return in 6 months or earlier if problem  5. Influenza = Tamiflu . Covid = Paxlovid

## 2024-02-07 ENCOUNTER — Encounter: Payer: Self-pay | Admitting: Allergy and Immunology

## 2024-08-07 ENCOUNTER — Ambulatory Visit: Admitting: Allergy and Immunology
